# Patient Record
Sex: Female | Born: 1940 | Race: White | Hispanic: No | Smoking: Former smoker
Health system: Southern US, Community
[De-identification: ages and names within clinical notes are randomized; demographics above are authoritative.]

## PROBLEM LIST (undated history)

## (undated) DIAGNOSIS — Q278 Other specified congenital malformations of peripheral vascular system: Secondary | ICD-10-CM

## (undated) DIAGNOSIS — I712 Thoracic aortic aneurysm, without rupture: Secondary | ICD-10-CM

## (undated) DIAGNOSIS — I10 Essential (primary) hypertension: Secondary | ICD-10-CM

## (undated) DIAGNOSIS — G4700 Insomnia, unspecified: Secondary | ICD-10-CM

## (undated) HISTORY — DX: Insomnia, unspecified: G47.00

## (undated) HISTORY — PX: ABDOMINAL HYSTERECTOMY: SHX81

## (undated) HISTORY — PX: ROTATOR CUFF REPAIR: SHX139

## (undated) HISTORY — DX: Other specified congenital malformations of peripheral vascular system: Q27.8

## (undated) HISTORY — DX: Thoracic aortic aneurysm, without rupture: I71.2

---

## 1997-10-25 ENCOUNTER — Ambulatory Visit (HOSPITAL_COMMUNITY): Admission: RE | Admit: 1997-10-25 | Discharge: 1997-10-25 | Payer: Self-pay | Admitting: Family Medicine

## 1998-11-03 ENCOUNTER — Emergency Department (HOSPITAL_COMMUNITY): Admission: EM | Admit: 1998-11-03 | Discharge: 1998-11-03 | Payer: Self-pay | Admitting: Emergency Medicine

## 2001-10-11 ENCOUNTER — Encounter: Payer: Self-pay | Admitting: Family Medicine

## 2001-10-11 ENCOUNTER — Ambulatory Visit (HOSPITAL_COMMUNITY): Admission: RE | Admit: 2001-10-11 | Discharge: 2001-10-11 | Payer: Self-pay | Admitting: Family Medicine

## 2011-04-09 ENCOUNTER — Emergency Department: Payer: Self-pay | Admitting: *Deleted

## 2012-12-30 ENCOUNTER — Emergency Department (HOSPITAL_COMMUNITY)
Admission: EM | Admit: 2012-12-30 | Discharge: 2012-12-30 | Disposition: A | Discharge status: Home / Self Care | Payer: Medicare Other | Attending: Emergency Medicine | Admitting: Emergency Medicine

## 2012-12-30 ENCOUNTER — Emergency Department (HOSPITAL_COMMUNITY): Payer: Medicare Other

## 2012-12-30 ENCOUNTER — Encounter (HOSPITAL_COMMUNITY): Payer: Self-pay | Admitting: Emergency Medicine

## 2012-12-30 DIAGNOSIS — S46909A Unspecified injury of unspecified muscle, fascia and tendon at shoulder and upper arm level, unspecified arm, initial encounter: Secondary | ICD-10-CM | POA: Insufficient documentation

## 2012-12-30 DIAGNOSIS — M25512 Pain in left shoulder: Secondary | ICD-10-CM

## 2012-12-30 DIAGNOSIS — S66912A Strain of unspecified muscle, fascia and tendon at wrist and hand level, left hand, initial encounter: Secondary | ICD-10-CM

## 2012-12-30 DIAGNOSIS — W010XXA Fall on same level from slipping, tripping and stumbling without subsequent striking against object, initial encounter: Secondary | ICD-10-CM | POA: Insufficient documentation

## 2012-12-30 DIAGNOSIS — S8990XA Unspecified injury of unspecified lower leg, initial encounter: Secondary | ICD-10-CM | POA: Insufficient documentation

## 2012-12-30 DIAGNOSIS — Y9389 Activity, other specified: Secondary | ICD-10-CM | POA: Insufficient documentation

## 2012-12-30 DIAGNOSIS — I10 Essential (primary) hypertension: Secondary | ICD-10-CM | POA: Insufficient documentation

## 2012-12-30 DIAGNOSIS — W19XXXA Unspecified fall, initial encounter: Secondary | ICD-10-CM

## 2012-12-30 DIAGNOSIS — M25561 Pain in right knee: Secondary | ICD-10-CM

## 2012-12-30 DIAGNOSIS — T148XXA Other injury of unspecified body region, initial encounter: Secondary | ICD-10-CM

## 2012-12-30 DIAGNOSIS — Z880 Allergy status to penicillin: Secondary | ICD-10-CM | POA: Insufficient documentation

## 2012-12-30 DIAGNOSIS — S63509A Unspecified sprain of unspecified wrist, initial encounter: Secondary | ICD-10-CM | POA: Insufficient documentation

## 2012-12-30 DIAGNOSIS — S4980XA Other specified injuries of shoulder and upper arm, unspecified arm, initial encounter: Secondary | ICD-10-CM | POA: Insufficient documentation

## 2012-12-30 DIAGNOSIS — Y929 Unspecified place or not applicable: Secondary | ICD-10-CM | POA: Insufficient documentation

## 2012-12-30 DIAGNOSIS — Z9889 Other specified postprocedural states: Secondary | ICD-10-CM | POA: Insufficient documentation

## 2012-12-30 DIAGNOSIS — S99929A Unspecified injury of unspecified foot, initial encounter: Secondary | ICD-10-CM | POA: Insufficient documentation

## 2012-12-30 HISTORY — DX: Essential (primary) hypertension: I10

## 2012-12-30 MED ORDER — ACETAMINOPHEN 325 MG PO TABS
650.0000 mg | ORAL_TABLET | Freq: Once | ORAL | Status: AC
  Administered 2012-12-30: 650 mg via ORAL
  Filled 2012-12-30: qty 2

## 2012-12-30 NOTE — ED Notes (Signed)
Pt states that she was walking on her brother's wet deck today and slipped and fell. States that she did not lose consciousness.  C/o lt wrist/hand pain, right knee, left shoulder pain.  Not c/o pain in her head but also has some cuts on her face.

## 2012-12-30 NOTE — ED Provider Notes (Signed)
CSN: [id number]     Arrival date & time 12/30/12  1751 History     First MD Initiated Contact with Patient 12/30/12 1916     Chief Complaint  Patient presents with  . Fall  . Hand Pain  . Shoulder Pain   (Consider location/radiation/quality/duration/timing/severity/associated sxs/prior Treatment) HPI Comments: Pt states that she slipped on a wet deck and now she is having pain in her left wrist,and shoulder and right knee:denies loc:pt states that she hit her left head on the deck:no visual changes  Patient is a 72 y.o. female presenting with fall, hand pain, and shoulder pain. The history is provided by the patient. No language interpreter was used.  Fall This is a new problem. The current episode started today. The problem occurs constantly. The problem has been unchanged. The symptoms are aggravated by bending. She has tried nothing for the symptoms.  Hand Pain  Shoulder Pain    Past Medical History  Diagnosis Date  . Hypertension    Past Surgical History  Procedure Laterality Date  . Rotator cuff repair    . Abdominal hysterectomy    . Cesarean section     History reviewed. No pertinent family history. History  Substance Use Topics  . Smoking status: Never Smoker   . Smokeless tobacco: Not on file  . Alcohol Use: No   OB History   Grav Para Term Preterm Abortions TAB SAB Ect Mult Living                 Review of Systems  Constitutional: Negative.   Respiratory: Negative.   Cardiovascular: Negative.     Allergies  Aspirin; Penicillins; and Vicodin  Home Medications  No current outpatient prescriptions on file. BP 134/92  Pulse 100  Temp(Src) 98.8 F (37.1 C) (Oral)  Resp 18  SpO2 100% Physical Exam  Nursing note and vitals reviewed. Constitutional: She is oriented to person, place, and time. She appears well-developed and well-nourished.  HENT:  Head: Normocephalic and atraumatic.  Nose: Nose normal.  Mouth/Throat: Oropharynx is clear and moist.   Eyes: Conjunctivae and EOM are normal. Pupils are equal, round, and reactive to light.  Neck: Normal range of motion. Neck supple.  Cardiovascular: Normal rate and regular rhythm.   Pulmonary/Chest: Effort normal and breath sounds normal.  Musculoskeletal: Normal range of motion.       Cervical back: Normal.       Thoracic back: Normal.       Lumbar back: Normal.  Swelling noted to the medial aspect of the left wrist:pt has full rom  Neurological: She is alert and oriented to person, place, and time.  Skin:  Abrasion to the left eyebrow    ED Course   Procedures (including critical care time)  Labs Reviewed - No data to display Dg Wrist Complete Left  12/30/2012   *RADIOLOGY REPORT*  Clinical Data: Trauma with pain and limited range of motion.  LEFT WRIST - COMPLETE 3+ VIEW  Comparison: None.  Findings: Mild osteopenia.  Scaphoid intact. No acute fracture or dislocation.  IMPRESSION: No acute osseous abnormality.   Original Report Authenticated By: Jeronimo Greaves, M.D.   Dg Shoulder Left  12/30/2012   *RADIOLOGY REPORT*  Clinical Data: Fall  LEFT SHOULDER - 2+ VIEW  Comparison: None.  Findings: No acute fracture and no dislocation.  IMPRESSION: No acute bony pathology.   Original Report Authenticated By: Jolaine Click, M.D.   Dg Knee Complete 4 Views Right  12/30/2012   *RADIOLOGY  REPORT*  Clinical Data: Fall.  Pain and limited range of motion.  RIGHT KNEE - COMPLETE 4+ VIEW  Comparison: None.  Findings: Question medial soft tissue swelling.  There may also new prepatellar soft tissue swelling. No acute fracture or dislocation. No joint effusion.  IMPRESSION: No acute osseous abnormality.   Original Report Authenticated By: Jeronimo Greaves, M.D.   1. Wrist strain, left, initial encounter   2. Fall, initial encounter   3. Abrasion   4. Knee pain, right   5. Shoulder pain, left     MDM  No acute bony abnormality noted:pt placed in splint for comfort:no loc:pt is neurologically  intact  Teressa Lower, NP 12/30/12 1930

## 2013-01-02 NOTE — ED Provider Notes (Signed)
Medical screening examination/treatment/procedure(s) were performed by non-physician practitioner and as supervising physician I was immediately available for consultation/collaboration.  Brecken Walth, MD 01/02/13 1702 

## 2013-03-08 ENCOUNTER — Other Ambulatory Visit: Payer: Self-pay | Admitting: Neurosurgery

## 2013-03-08 DIAGNOSIS — M542 Cervicalgia: Secondary | ICD-10-CM

## 2013-03-20 ENCOUNTER — Ambulatory Visit
Admission: RE | Admit: 2013-03-20 | Discharge: 2013-03-20 | Disposition: A | Discharge status: Home / Self Care | Payer: Medicare Other | Source: Ambulatory Visit | Attending: Neurosurgery | Admitting: Neurosurgery

## 2013-03-20 DIAGNOSIS — M542 Cervicalgia: Secondary | ICD-10-CM

## 2013-03-20 MED ORDER — GADOBENATE DIMEGLUMINE 529 MG/ML IV SOLN
20.0000 mL | Freq: Once | INTRAVENOUS | Status: AC | PRN
  Administered 2013-03-20: 20 mL via INTRAVENOUS

## 2013-03-21 ENCOUNTER — Other Ambulatory Visit: Payer: Self-pay | Admitting: Neurosurgery

## 2013-03-21 DIAGNOSIS — M542 Cervicalgia: Secondary | ICD-10-CM

## 2014-12-10 ENCOUNTER — Other Ambulatory Visit: Payer: Self-pay | Admitting: Family Medicine

## 2014-12-10 DIAGNOSIS — R221 Localized swelling, mass and lump, neck: Secondary | ICD-10-CM

## 2014-12-13 ENCOUNTER — Ambulatory Visit
Admission: RE | Admit: 2014-12-13 | Discharge: 2014-12-13 | Disposition: A | Discharge status: Home / Self Care | Payer: Medicare Other | Source: Ambulatory Visit | Attending: Family Medicine | Admitting: Family Medicine

## 2014-12-13 DIAGNOSIS — R221 Localized swelling, mass and lump, neck: Secondary | ICD-10-CM

## 2015-02-27 ENCOUNTER — Other Ambulatory Visit: Payer: Self-pay | Admitting: Family Medicine

## 2015-02-27 DIAGNOSIS — R229 Localized swelling, mass and lump, unspecified: Secondary | ICD-10-CM

## 2015-02-28 ENCOUNTER — Observation Stay (HOSPITAL_COMMUNITY)
Admission: EM | Admit: 2015-02-28 | Discharge: 2015-03-01 | Disposition: A | Discharge status: Home / Self Care | Payer: Medicare Other | Attending: Internal Medicine | Admitting: Internal Medicine

## 2015-02-28 ENCOUNTER — Emergency Department (HOSPITAL_COMMUNITY): Payer: Medicare Other

## 2015-02-28 ENCOUNTER — Encounter (HOSPITAL_COMMUNITY): Payer: Self-pay

## 2015-02-28 DIAGNOSIS — Z880 Allergy status to penicillin: Secondary | ICD-10-CM | POA: Diagnosis not present

## 2015-02-28 DIAGNOSIS — Z79899 Other long term (current) drug therapy: Secondary | ICD-10-CM | POA: Insufficient documentation

## 2015-02-28 DIAGNOSIS — R011 Cardiac murmur, unspecified: Secondary | ICD-10-CM | POA: Diagnosis not present

## 2015-02-28 DIAGNOSIS — R110 Nausea: Secondary | ICD-10-CM | POA: Diagnosis not present

## 2015-02-28 DIAGNOSIS — R42 Dizziness and giddiness: Secondary | ICD-10-CM | POA: Diagnosis not present

## 2015-02-28 DIAGNOSIS — R079 Chest pain, unspecified: Secondary | ICD-10-CM | POA: Diagnosis not present

## 2015-02-28 DIAGNOSIS — R05 Cough: Secondary | ICD-10-CM | POA: Diagnosis not present

## 2015-02-28 DIAGNOSIS — I10 Essential (primary) hypertension: Secondary | ICD-10-CM | POA: Diagnosis present

## 2015-02-28 LAB — CBC WITH DIFFERENTIAL/PLATELET
Basophils Absolute: 0.1 10*3/uL (ref 0.0–0.1)
Basophils Relative: 1 %
Eosinophils Absolute: 0 10*3/uL (ref 0.0–0.7)
Eosinophils Relative: 0 %
HCT: 42.1 % (ref 36.0–46.0)
Hemoglobin: 14 g/dL (ref 12.0–15.0)
Lymphocytes Relative: 24 %
Lymphs Abs: 2 10*3/uL (ref 0.7–4.0)
MCH: 30.2 pg (ref 26.0–34.0)
MCHC: 33.3 g/dL (ref 30.0–36.0)
MCV: 90.7 fL (ref 78.0–100.0)
Monocytes Absolute: 0.5 10*3/uL (ref 0.1–1.0)
Monocytes Relative: 6 %
Neutro Abs: 5.5 10*3/uL (ref 1.7–7.7)
Neutrophils Relative %: 69 %
Platelets: 241 10*3/uL (ref 150–400)
RBC: 4.64 MIL/uL (ref 3.87–5.11)
RDW: 12.5 % (ref 11.5–15.5)
WBC: 8 10*3/uL (ref 4.0–10.5)

## 2015-02-28 LAB — URINALYSIS, ROUTINE W REFLEX MICROSCOPIC
Bilirubin Urine: NEGATIVE
Glucose, UA: NEGATIVE mg/dL
Hgb urine dipstick: NEGATIVE
Ketones, ur: NEGATIVE mg/dL
Leukocytes, UA: NEGATIVE
Nitrite: NEGATIVE
Protein, ur: NEGATIVE mg/dL
Specific Gravity, Urine: 1.018 (ref 1.005–1.030)
Urobilinogen, UA: 0.2 mg/dL (ref 0.0–1.0)
pH: 5.5 (ref 5.0–8.0)

## 2015-02-28 LAB — COMPREHENSIVE METABOLIC PANEL
ALT: 11 U/L — ABNORMAL LOW (ref 14–54)
AST: 21 U/L (ref 15–41)
Albumin: 3.7 g/dL (ref 3.5–5.0)
Alkaline Phosphatase: 89 U/L (ref 38–126)
Anion gap: 10 (ref 5–15)
BUN: 14 mg/dL (ref 6–20)
CO2: 26 mmol/L (ref 22–32)
Calcium: 9.3 mg/dL (ref 8.9–10.3)
Chloride: 104 mmol/L (ref 101–111)
Creatinine, Ser: 0.95 mg/dL (ref 0.44–1.00)
GFR calc Af Amer: >60 mL/min (ref >60)
GFR calc non Af Amer: 58 mL/min — ABNORMAL LOW (ref >60)
Glucose, Bld: 112 mg/dL — ABNORMAL HIGH (ref 65–99)
Potassium: 3.6 mmol/L (ref 3.5–5.1)
Sodium: 140 mmol/L (ref 135–145)
Total Bilirubin: 0.6 mg/dL (ref 0.3–1.2)
Total Protein: 6.8 g/dL (ref 6.5–8.1)

## 2015-02-28 LAB — TROPONIN I: Troponin I: <0.03 ng/mL (ref <0.031)

## 2015-02-28 LAB — I-STAT TROPONIN, ED: Troponin i, poc: 0 ng/mL (ref 0.00–0.08)

## 2015-02-28 LAB — TSH: TSH: 0.819 u[IU]/mL (ref 0.350–4.500)

## 2015-02-28 MED ORDER — ACETAMINOPHEN 325 MG PO TABS: 650.0000 mg | ORAL_TABLET | ORAL | Status: DC | PRN

## 2015-02-28 MED ORDER — ONDANSETRON HCL 4 MG/2ML IJ SOLN: 4.0000 mg | Freq: Four times a day (QID) | INTRAMUSCULAR | Status: DC | PRN

## 2015-02-28 MED ORDER — ENOXAPARIN SODIUM 40 MG/0.4ML ~~LOC~~ SOLN
40.0000 mg | SUBCUTANEOUS | Status: DC
  Administered 2015-02-28: 40 mg via SUBCUTANEOUS
  Filled 2015-02-28: qty 0.4

## 2015-02-28 NOTE — Progress Notes (Signed)
Attempted report. Joaquin Bend E, RN 02/28/2015 8:01 PM

## 2015-02-28 NOTE — ED Notes (Signed)
Pt presents with onset of generalized weakness that began yesterday.  Pt denies any pain, reports nausea - denies vomiting.  Pt reports dry cough denies shortness of breath.  Pt also reports area to L side of neck that has been swollen since July.

## 2015-02-28 NOTE — ED Provider Notes (Signed)
CSN: [id number]     Arrival date & time 02/28/15  1434 History   First MD Initiated Contact with Patient 02/28/15 1523     No chief complaint on file.  (Consider location/radiation/quality/duration/timing/severity/associated sxs/prior Treatment) Patient is a 74 y.o. female presenting with weakness.  Weakness This is a new problem. The current episode started yesterday. The problem occurs constantly. The problem has been unchanged. Associated symptoms include coughing, fatigue, nausea and weakness. Pertinent negatives include no abdominal pain, anorexia, chills, congestion, diaphoresis, fever, headaches, numbness, urinary symptoms or vomiting. Associated symptoms comments: Chest pressure. The symptoms are aggravated by walking and exertion. She has tried nothing for the symptoms.    Past Medical History  Diagnosis Date  . Hypertension    Past Surgical History  Procedure Laterality Date  . Rotator cuff repair    . Abdominal hysterectomy    . Cesarean section     History reviewed. No pertinent family history. Social History  Substance Use Topics  . Smoking status: Never Smoker   . Smokeless tobacco: None  . Alcohol Use: No   OB History    No data available     Review of Systems  Constitutional: Positive for fatigue. Negative for fever, chills and diaphoresis.  HENT: Negative for congestion.   Respiratory: Positive for cough.   Gastrointestinal: Positive for nausea. Negative for vomiting, abdominal pain and anorexia.       A few episodes of black stool with peptobismol  Genitourinary: Negative for dysuria, decreased urine volume and vaginal bleeding.  Skin: Negative for pallor.  Neurological: Positive for weakness and light-headedness. Negative for numbness and headaches.  Psychiatric/Behavioral: Negative for behavioral problems and agitation.  All other systems reviewed and are negative.    Allergies  Aspirin; Penicillins; and Vicodin  Home Medications   Prior to  Admission medications   Medication Sig Start Date End Date Taking? Authorizing Provider  clonazePAM (KLONOPIN) 0.5 MG tablet Take 0.125 mg by mouth at bedtime as needed for anxiety.    Historical Provider, MD  ibuprofen (ADVIL,MOTRIN) 200 MG tablet Take 400 mg by mouth every 6 (six) hours as needed for pain.    Historical Provider, MD  metoprolol succinate (TOPROL-XL) 50 MG 24 hr tablet Take 25 mg by mouth 2 (two) times daily. Take with or immediately following a meal.    Historical Provider, MD  triamterene-hydrochlorothiazide (MAXZIDE-25) 37.5-25 MG per tablet Take 1 tablet by mouth daily.    Historical Provider, MD   BP 153/77 mmHg  Pulse 86  Temp(Src) 99.1 F (37.3 C) (Oral)  Resp 18  Ht 5\' 6"  (1.676 m)  Wt 235 lb (106.595 kg)  BMI 37.95 kg/m2  SpO2 98% Physical Exam  Constitutional: She is oriented to person, place, and time. She appears well-developed and well-nourished. No distress.  Obese female in NAD  HENT:  Head: Normocephalic and atraumatic.  Mouth/Throat: No oropharyngeal exudate.  Eyes: Pupils are equal, round, and reactive to light.  Neck: Normal range of motion.  Cardiovascular: Normal rate.   II/IV systolic murmur  Pulmonary/Chest: Effort normal and breath sounds normal. No respiratory distress. She has no wheezes. She has no rales. She exhibits no tenderness.  Abdominal: Soft. She exhibits no distension. There is no tenderness. There is no rebound and no guarding.  Neurological: She is alert and oriented to person, place, and time. No cranial nerve deficit. She exhibits normal muscle tone. Coordination normal.  Unable to assess gait due to lightheadedness  Skin: Skin is warm and dry.  She is not diaphoretic. No erythema.  Psychiatric: She has a normal mood and affect. Her behavior is normal. Judgment and thought content normal.  Nursing note and vitals reviewed.   ED Course  Procedures (including critical care time) Labs Review Labs Reviewed  CBC WITH  DIFFERENTIAL/PLATELET  COMPREHENSIVE METABOLIC PANEL  TSH  URINALYSIS, ROUTINE W REFLEX MICROSCOPIC (NOT AT [facility])  OCCULT BLOOD X 1 CARD TO LAB, STOOL  I-STAT TROPOININ, ED    Imaging Review Dg Chest 2 View  02/28/2015   CLINICAL DATA:  Chest pressure. Weakness. Symptoms began yesterday. Personal history of hypertension.  EXAM: CHEST  2 VIEW  COMPARISON:  None.  FINDINGS: Heart size is normal. Mediastinal shadows are normal except for mild spinal curvature. The pulmonary vascularity is normal. Lungs are clear. No effusions.  IMPRESSION: No active disease.  Spinal curvature.   Electronically Signed   By: Nelson Chimes M.D.   On: 02/28/2015 16:39   I have personally reviewed and evaluated these images and lab results as part of my medical decision-making.   EKG Interpretation   Date/Time:  Friday February 28 2015 14:58:14 EDT Ventricular Rate:  72 PR Interval:  168 QRS Duration: 94 QT Interval:  376 QTC Calculation: 411 R Axis:   -13 Text Interpretation:  Sinus rhythm with frequent Premature ventricular  complexes Nonspecific T wave abnormality Abnormal ECG Confirmed by ZAVITZ   MD, JOSHUA ([id number]) on 02/28/2015 4:34:25 PM     MDM   Patient presents emergency department today with weakness that's worse with exertion associated with lightheadedness started yesterday. Patient also endorses chest pressure during this time. Patient denies any shortness of breath, diaphoresis with these previous episodes until recently having nausea  She has multiple complaints including left side of neck swelling which has been ultrasounded previously in July and has ha multiple physicians evaluate her left neck swelling but without additional imaging other than ultrasound. Patient states that she has this draws fatigue is worse with exertion. She only has a history of hypertension, obesity and denies any diabetes, hypercholesterolemia or smoking. Low well's criteria. Doubt PE.  Patient's heart scores of 5.  Patient will have admission to rule out ACS. She will have regular follow-up regarding this left side neck swelling and at this point do not believe that she needs further imaging or investigation at this time. Patient admitted without further incident.     Final diagnoses:  Chest pain, unspecified chest pain type       Roberto Scales, MD 03/01/15 0246  Elnora Morrison, MD 03/01/15 [phone]

## 2015-02-28 NOTE — ED Notes (Signed)
Upon leaving triage room, pt reports dizziness and had to sit down.  EKG performed.

## 2015-02-28 NOTE — ED Notes (Signed)
Dr. Gardner at bedside 

## 2015-02-28 NOTE — H&P (Signed)
[facility] History and Physical  DESHAUNA CAYSON BHA:[id number] DOB: June 30, 1940 DOA: 02/28/2015  Referring physician: EDP PCP: No primary care provider on file.   Chief Complaint: Chest pressure   HPI: Cristina Bush is a 74 y.o. female who presents to the ED with c/o generalized weakness, central chest pressure.  Symptoms onset yesterday and have been persistent.  Worsen with exertion, better at rest.  Currently 0/10 pain.  She has tried nothing for symptoms.  Review of Systems: Systems reviewed.  As above, otherwise negative  Past Medical History  Diagnosis Date  . Hypertension    Past Surgical History  Procedure Laterality Date  . Rotator cuff repair    . Abdominal hysterectomy    . Cesarean section     Social History:  reports that she has never smoked. She does not have any smokeless tobacco history on file. She reports that she does not drink alcohol or use illicit drugs.  Allergies  Allergen Reactions  . Meloxicam     Severe chest pain  . Aspirin   . Penicillins     hives  . Vicodin [Hydrocodone-Acetaminophen] Nausea And Vomiting    History reviewed. No pertinent family history.   Prior to Admission medications   Medication Sig Start Date End Date Taking? Authorizing Provider  clonazePAM (KLONOPIN) 0.5 MG tablet Take 0.125 mg by mouth at bedtime as needed for anxiety.    Historical Provider, MD  ibuprofen (ADVIL,MOTRIN) 200 MG tablet Take 400 mg by mouth every 6 (six) hours as needed for pain.    Historical Provider, MD  metoprolol succinate (TOPROL-XL) 50 MG 24 hr tablet Take 25 mg by mouth 2 (two) times daily. Take with or immediately following a meal.    Historical Provider, MD  triamterene-hydrochlorothiazide (MAXZIDE-25) 37.5-25 MG per tablet Take 1 tablet by mouth daily.    Historical Provider, MD   Physical Exam: Filed Vitals:   02/28/15 1615  BP: 131/63  Pulse: 95  Temp:   Resp: 31    BP 131/63 mmHg  Pulse 95  Temp(Src) 99.1 F (37.3 C)  (Oral)  Resp 31  Ht 5\' 6"  (1.676 m)  Wt 106.595 kg (235 lb)  BMI 37.95 kg/m2  SpO2 97%  General Appearance:    Alert, oriented, no distress, appears stated age  Head:    Normocephalic, atraumatic  Eyes:    PERRL, EOMI, sclera non-icteric        Nose:   Nares without drainage or epistaxis. Mucosa, turbinates normal  Throat:   Moist mucous membranes. Oropharynx without erythema or exudate.  Neck:   Supple. No carotid bruits.  No thyromegaly.  No lymphadenopathy.   Back:     No CVA tenderness, no spinal tenderness  Lungs:     Clear to auscultation bilaterally, without wheezes, rhonchi or rales  Chest wall:    No tenderness to palpitation  Heart:    Regular rate and rhythm 2/6 murmur  Abdomen:     Soft, non-tender, nondistended, normal bowel sounds, no organomegaly  Genitalia:    deferred  Rectal:    deferred  Extremities:   No clubbing, cyanosis or edema.  Pulses:   2+ and symmetric all extremities  Skin:   Skin color, texture, turgor normal, no rashes or lesions  Lymph nodes:   Cervical, supraclavicular, and axillary nodes normal  Neurologic:   CNII-XII intact. Normal strength, sensation and reflexes      throughout    Labs on Admission:  Basic Metabolic Panel:  Recent Labs Lab 02/28/15 1713  NA 140  K 3.6  CL 104  CO2 26  GLUCOSE 112*  BUN 14  CREATININE 0.95  CALCIUM 9.3   Liver Function Tests:  Recent Labs Lab 02/28/15 1713  AST 21  ALT 11*  ALKPHOS 89  BILITOT 0.6  PROT 6.8  ALBUMIN 3.7   No results for input(s): LIPASE, AMYLASE in the last 168 hours. No results for input(s): AMMONIA in the last 168 hours. CBC:  Recent Labs Lab 02/28/15 1713  WBC 8.0  NEUTROABS 5.5  HGB 14.0  HCT 42.1  MCV 90.7  PLT 241   Cardiac Enzymes: No results for input(s): CKTOTAL, CKMB, CKMBINDEX, TROPONINI in the last 168 hours.  BNP (last 3 results) No results for input(s): PROBNP in the last 8760 hours. CBG: No results for input(s): GLUCAP in the last 168  hours.  Radiological Exams on Admission: Dg Chest 2 View  02/28/2015   CLINICAL DATA:  Chest pressure. Weakness. Symptoms began yesterday. Personal history of hypertension.  EXAM: CHEST  2 VIEW  COMPARISON:  None.  FINDINGS: Heart size is normal. Mediastinal shadows are normal except for mild spinal curvature. The pulmonary vascularity is normal. Lungs are clear. No effusions.  IMPRESSION: No active disease.  Spinal curvature.   Electronically Signed   By: Nelson Chimes M.D.   On: 02/28/2015 16:39    EKG: Independently reviewed.  Assessment/Plan Active Problems:   Chest pain   1. Chest pain - 1. Chest pain obs pathway 2. Serial trops 3. Tele monitor 4. NPO after midnight 5. 2d echo ordered due to presence of murmur.    Code Status: Full Code  Family Communication: No family in room Disposition Plan: Admit to obs   Time spent: 50 min  Berline Semrad M. [facility] Pager [phone]  If 7AM-7PM, please contact the day team taking care of the patient [email or web address] Password [email or web address] 02/28/2015, 7:30 PM

## 2015-02-28 NOTE — Progress Notes (Signed)
Patient arrived from ED via nurse with daughter. VSS and reporting no pain. Cristina Bush, [place] 02/28/2015 2045

## 2015-03-01 ENCOUNTER — Observation Stay (HOSPITAL_COMMUNITY): Payer: Medicare Other

## 2015-03-01 ENCOUNTER — Encounter (HOSPITAL_COMMUNITY): Payer: Self-pay | Admitting: Cardiology

## 2015-03-01 DIAGNOSIS — I209 Angina pectoris, unspecified: Secondary | ICD-10-CM

## 2015-03-01 DIAGNOSIS — R079 Chest pain, unspecified: Secondary | ICD-10-CM | POA: Diagnosis not present

## 2015-03-01 DIAGNOSIS — I10 Essential (primary) hypertension: Secondary | ICD-10-CM | POA: Diagnosis not present

## 2015-03-01 LAB — NM MYOCAR MULTI W/SPECT W/WALL MOTION / EF
Estimated workload: 1 METS
MPHR: 147 {beats}/min
Peak HR: 113 {beats}/min
Percent HR: 76 %
Rest HR: 75 {beats}/min

## 2015-03-01 LAB — TROPONIN I: Troponin I: <0.03 ng/mL (ref <0.031)

## 2015-03-01 MED ORDER — TECHNETIUM TC 99M SESTAMIBI GENERIC - CARDIOLITE
10.0000 | Freq: Once | INTRAVENOUS | Status: AC | PRN
  Administered 2015-03-01: 10 via INTRAVENOUS

## 2015-03-01 MED ORDER — TECHNETIUM TC 99M SESTAMIBI - CARDIOLITE
30.0000 | Freq: Once | INTRAVENOUS | Status: AC | PRN
  Administered 2015-03-01: 30 via INTRAVENOUS

## 2015-03-01 MED ORDER — REGADENOSON 0.4 MG/5ML IV SOLN
INTRAVENOUS | Status: AC
  Filled 2015-03-01: qty 5

## 2015-03-01 MED ORDER — REGADENOSON 0.4 MG/5ML IV SOLN
0.4000 mg | Freq: Once | INTRAVENOUS | Status: AC
  Administered 2015-03-01: 0.4 mg via INTRAVENOUS
  Filled 2015-03-01: qty 5

## 2015-03-01 NOTE — Progress Notes (Signed)
Patient discharge summary gone over in detail with patient and daughter. All questions answered to patients satisfaction. IV removed intact, telemetry discontinued. Patient discharged to home with daughter by way of wheelchair.

## 2015-03-01 NOTE — Progress Notes (Addendum)
Pt escorted to nuclear stress stretcher while lying pt with tremor like activity of upper and lower extremities, no LOC, pt assisted with HOB up with resolution of symptoms, denies pain or hx of seizures. Denies fever or chills. Geannie Risen PA notified.

## 2015-03-01 NOTE — Consult Note (Signed)
Patient ID: Cristina Bush MRN: [id number], DOB/AGE: 08/27/1940   Admit date: 02/28/2015   Primary Physician: No primary care provider on file. Primary Cardiologist: New  Pt. Profile:  74 y/o female with a PMH significant for HTN but no other cardiovascular history, being evaluated for chest pain.   Problem List  Past Medical History  Diagnosis Date  . Hypertension     Past Surgical History  Procedure Laterality Date  . Rotator cuff repair    . Abdominal hysterectomy    . Cesarean section       Allergies  Allergies  Allergen Reactions  . Meloxicam     Severe chest pain  . Aspirin Nausea And Vomiting  . Penicillins Hives and Swelling      . Vicodin [Hydrocodone-Acetaminophen] Nausea And Vomiting    HPI  74 y/o female with no prior cardiac history being evaluated for recent chest discomfort. Her past medical history is significant for treated HTN, on metoprolol and HCTZ. She notes a remote h/o tobacco abuse, having quit over 40 years ago. She denies any h/o HLD or DM. No family h/o CAD.  She reports that she was in her usual state of health until 3 days ago when she developed sudden onset of chest pressure while walking up a flight of stairs. This has never happened to her before. She denies any associated dyspnea, diaphoresis, palpitations, syncope/ near syncope. Non radiating. It resolved spontaneously, though she cannot recall exactly how long it lasted.   The following 2 days, she has felt poorly with generalized malaise and weakness, but no recurrent CP. She does note recent weakness in both upper extremities yesterday, but this has resolved. She also felt nauseated yesterday. Her symptoms prompted her to come to the ED. She did mention her recent CP symptoms, thus cardiology consulted.    She is now CP free and denies any further symptoms since her initial event 3 days ago. She feels better overall. No dyspnea. Cardiac enzymes are negative x 2. EKG shows NSR with  frequent PVCs and nonspecific Twave abnormalities. No prior EKGs for comparison. Telemetry shows NSR with frequent PVCs as well. Electrolytes and TSH are normal.  CXR is unremarkable. CBC and BMP are also unremarkable and vital signs have remained stable.    Home Medications  Prior to Admission medications   Medication Sig Start Date End Date Taking? Authorizing Provider  clonazePAM (KLONOPIN) 0.5 MG tablet Take 0.25 mg by mouth at bedtime.    Yes Historical Provider, MD  ibuprofen (ADVIL,MOTRIN) 200 MG tablet Take 400 mg by mouth every 6 (six) hours as needed for pain.   Yes Historical Provider, MD  metoprolol succinate (TOPROL-XL) 50 MG 24 hr tablet Take 25 mg by mouth 2 (two) times daily. Take with or immediately following a meal.   Yes Historical Provider, MD  triamterene-hydrochlorothiazide (MAXZIDE-25) 37.5-25 MG per tablet Take 0.5 tablets by mouth daily.    Yes Historical Provider, MD    Family History  Family History  Problem Relation Age of Onset  . Hypertension Maternal Grandfather     Social History  Social History   Social History  . Marital Status: Widowed    Spouse Name: N/A  . Number of Children: N/A  . Years of Education: N/A   Occupational History  . Not on file.   Social History Main Topics  . Smoking status: Former Smoker -- 1.00 packs/day for 8 years  . Smokeless tobacco: Not on file  . Alcohol  Use: No  . Drug Use: No  . Sexual Activity: Not on file   Other Topics Concern  . Not on file   Social History Narrative     Review of Systems General:  No chills, fever, night sweats or weight changes.  Cardiovascular:  No chest pain, dyspnea on exertion, edema, orthopnea, palpitations, paroxysmal nocturnal dyspnea. Dermatological: No rash, lesions/masses Respiratory: No cough, dyspnea Urologic: No hematuria, dysuria Abdominal:   No nausea, vomiting, diarrhea, bright red blood per rectum, melena, or hematemesis Neurologic:  No visual changes, wkns,  changes in mental status. All other systems reviewed and are otherwise negative except as noted above.  Physical Exam  Blood pressure 117/68, pulse 70, temperature 98 F (36.7 C), temperature source Oral, resp. rate 16, height 5\' 6"  (1.676 m), weight 237 lb 6.4 oz (107.684 kg), SpO2 98 %.  General: Pleasant, NAD Psych: Normal affect. Neuro: Alert and oriented X 3. Moves all extremities spontaneously. HEENT: Normal  Neck: Supple without bruits or JVD. Lungs:  Resp regular and unlabored, CTA. Heart: RRR with occasional PVCs, 1/6 SM along RSB Abdomen: Soft, non-tender, non-distended, BS + x 4.  Extremities: No clubbing, cyanosis or edema. DP/PT/Radials 2+ and equal bilaterally.  Labs  Troponin [facility] of Care Test)  Recent Labs  02/28/15 1844  TROPIPOC 0.00    Recent Labs  02/28/15 2211 03/01/15 0136  TROPONINI <0.03 <0.03   Lab Results  Component Value Date   WBC 8.0 02/28/2015   HGB 14.0 02/28/2015   HCT 42.1 02/28/2015   MCV 90.7 02/28/2015   PLT 241 02/28/2015     Recent Labs Lab 02/28/15 1713  NA 140  K 3.6  CL 104  CO2 26  BUN 14  CREATININE 0.95  CALCIUM 9.3  PROT 6.8  BILITOT 0.6  ALKPHOS 89  ALT 11*  AST 21  GLUCOSE 112*   No results found for: CHOL, HDL, LDLCALC, TRIG No results found for: DDIMER   Radiology/Studies  Dg Chest 2 View  02/28/2015   CLINICAL DATA:  Chest pressure. Weakness. Symptoms began yesterday. Personal history of hypertension.  EXAM: CHEST  2 VIEW  COMPARISON:  None.  FINDINGS: Heart size is normal. Mediastinal shadows are normal except for mild spinal curvature. The pulmonary vascularity is normal. Lungs are clear. No effusions.  IMPRESSION: No active disease.  Spinal curvature.   Electronically Signed   By: Nelson Chimes M.D.   On: 02/28/2015 16:39    ECG  NSR with frequent PVCS. Nonspecific Twave abnormalities.   Echocardiogram - pending.   ASSESSMENT AND PLAN  Active Problems:   Chest pain  1. Chest Pain: 74  y/o female, whose only cardiac risk factor is HTN, who had one episode of exertional substernal chest pressure 3 days ago w/o any further recurrence. Cardiac enzymes are negative x 3 and EKG shows NSR with frequent PVCs and nonspecific Twave abnormalities. Would recommend NST to risk stratify. Agree with 2D echo. If negative NST, no further ischemic w/u.   2. PVCs: noted on EKG and telemetry. TSH normal as well as electrolytes. She is on metoprolol, 25 mg daily. Will continue to hold for now, given possibility of exercise stress test today. Can resume post stress test. Agree with 2D echo (also to evaluate murmur).  3. HTN: BP is well controlled.     Signed, Lyda Jester, PA-C 03/01/2015, 8:42 AM Typical chest pain although singular in then followed by a very unusual syndrome of weakness in arms and legs and feeling  terrible all over. Notably also, she has NO RECOLLECTION of the events following the chest pain. She says that lack of memory is not normally a problem and her daughter agrees with this.  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  I think it is reasonable to undertake stress testing to exclude coronary disease. She has a significant fear of falling as she has fallen twice injuring her neck on one occasion and her shoulder on another; hence, undertaking treadmill testing is a year which she cannot mentally surmount. We will do it with Lexiscan.  PE unremarkable And ECG only notable for PVCs

## 2015-03-01 NOTE — Discharge Summary (Signed)
Physician Discharge Summary  Cristina Bush:[id number] DOB: [birth year] DOA: 02/28/2015  PCP: No primary care provider on file.  Admit date: 02/28/2015 Discharge date: 03/01/2015  Time spent: > 30 minutes  Recommendations for Outpatient Follow-up:  1. Follow up with PCP in 2-4 weeks   Discharge Diagnoses:  Active Problems:   Chest pain   Essential hypertension  Discharge Condition: stable  Diet recommendation: heart healthy  Filed Weights   02/28/15 1441 02/28/15 2030  Weight: 106.595 kg (235 lb) 107.684 kg (237 lb 6.4 oz)   History of present illness:  Cristina Bush is a 74 y.o. female who presents to the ED with c/o generalized weakness, central chest pressure. Symptoms onset yesterday and have been persistent. Worsen with exertion, better at rest. Currently 0/10 pain. She has tried nothing for symptoms.  Hospital Course:  Patient was admitted to the hospital with chest pain of sudden onset while she was going up some stairs, relieved at rest. She has a history of hypertension, but no other major risk factors. Given description of the pain, cardiology was consulted and patient underwent a stress test without any evidence of reversible ischemia or infarction, normal left ventricular wall motion and left ventricular ejection fraction of 58%, overall deemed to be a low risk stress test. While hospitalized, patient was feeling back to baseline, she denied any chest pain, she was able tablet in the hallway without any dyspnea or other difficulties. She was discharged home in stable condition and instructed to follow-up with her primary care provider as scheduled for blood pressure control.  Procedures:  Stress test   Consultations:  Cardiology   Discharge Exam: Filed Vitals:   03/01/15 1227 03/01/15 1229 03/01/15 1231 03/01/15 1419  BP: 125/71 117/76 121/77 123/78  Pulse: 98 90 85 78  Temp:    98 F (36.7 C)  TempSrc:    Oral  Resp:    16  Height:      Weight:        SpO2:    97%   General: NAD Cardiovascular: RRR Respiratory: CTA biL  Discharge Instructions     Medication List    TAKE these medications        clonazePAM 0.5 MG tablet  Commonly known as:  KLONOPIN  Take 0.25 mg by mouth at bedtime.     ibuprofen 200 MG tablet  Commonly known as:  ADVIL,MOTRIN  Take 400 mg by mouth every 6 (six) hours as needed for pain.     metoprolol succinate 50 MG 24 hr tablet  Commonly known as:  TOPROL-XL  Take 25 mg by mouth 2 (two) times daily. Take with or immediately following a meal.     triamterene-hydrochlorothiazide 37.5-25 MG tablet  Commonly known as:  MAXZIDE-25  Take 0.5 tablets by mouth daily.           Follow-up Information    Follow up with PCP. Schedule an appointment as soon as possible for a visit in 2 weeks.      The results of significant diagnostics from this hospitalization (including imaging, microbiology, ancillary and laboratory) are listed below for reference.    Significant Diagnostic Studies: Dg Chest 2 View  02/28/2015   CLINICAL DATA:  Chest pressure. Weakness. Symptoms began yesterday. Personal history of hypertension.  EXAM: CHEST  2 VIEW  COMPARISON:  None.  FINDINGS: Heart size is normal. Mediastinal shadows are normal except for mild spinal curvature. The pulmonary vascularity is normal. Lungs are clear. No effusions.  IMPRESSION: No active disease.  Spinal curvature.   Electronically Signed   By: Cristina Bush M.D.   On: 02/28/2015 16:39   Nm Myocar Multi W/spect W/wall Motion / Ef  03/01/2015   CLINICAL DATA:  74 year old with current history of hypertension, presenting with chest pain.  EXAM: MYOCARDIAL IMAGING WITH SPECT (REST AND PHARMACOLOGIC-STRESS)  GATED LEFT VENTRICULAR WALL MOTION STUDY  LEFT VENTRICULAR EJECTION FRACTION  TECHNIQUE: Standard myocardial SPECT imaging was performed after resting intravenous injection of 10 mCi Tc-22m sestamibi. Subsequently, intravenous infusion of Lexiscan was  performed under the supervision of the Cardiology staff. At peak effect of the drug, 30 mCi Tc-70m sestamibi was injected intravenously and standard myocardial SPECT imaging was performed. Quantitative gated imaging was also performed to evaluate left ventricular wall motion, and estimate left ventricular ejection fraction.  COMPARISON:  None.  FINDINGS: Perfusion: Large segment of diminished activity involving the inferior wall without reversibility. No perfusion defects elsewhere.  Wall Motion: Normal left ventricular wall motion. No left ventricular dilation.  Left Ventricular Ejection Fraction: 09%  End diastolic volume 80 ml  End systolic volume 33 ml  IMPRESSION: 1. No convincing evidence of reversible ischemia or infarction. Diaphragmatic attenuation involving the anterior wall is favored over infarction.  2. Normal left ventricular wall motion.  3. Left ventricular ejection fraction 58%  4. Low-risk stress test findings*.  *2012 Appropriate Use Criteria for Coronary Revascularization Focused Update: J Am Coll Cardiol. 8119;14(7):829-562. http://content.airportbarriers.com.aspx?articleid=1201161   Electronically Signed   By: Cristina Bush M.D.   On: 03/01/2015 16:25   Labs: Basic Metabolic Panel:  Recent Labs Lab 02/28/15 1713  NA 140  K 3.6  CL 104  CO2 26  GLUCOSE 112*  BUN 14  CREATININE 0.95  CALCIUM 9.3   Liver Function Tests:  Recent Labs Lab 02/28/15 1713  AST 21  ALT 11*  ALKPHOS 89  BILITOT 0.6  PROT 6.8  ALBUMIN 3.7   CBC:  Recent Labs Lab 02/28/15 1713  WBC 8.0  NEUTROABS 5.5  HGB 14.0  HCT 42.1  MCV 90.7  PLT 241   Cardiac Enzymes:  Recent Labs Lab 02/28/15 2211 03/01/15 0136  TROPONINI <0.03 <0.03    Signed:  Marzetta Bush  [facility] 03/01/2015, 5:36 PM

## 2015-03-01 NOTE — Discharge Instructions (Signed)
Follow with PCP in 5-7 days  Please get a complete blood count and chemistry panel checked by your Primary MD at your next visit, and again as instructed by your Primary MD. Please get your medications reviewed and adjusted by your Primary MD.  Please request your Primary MD to go over all Hospital Tests and Procedure/Radiological results at the follow up, please get all Hospital records sent to your Prim MD by signing hospital release before you go home.  If you had Pneumonia of Lung problems at the Hospital: Please get a 2 view Chest X ray done in 6-8 weeks after hospital discharge or sooner if instructed by your Primary MD.  If you have Congestive Heart Failure: Please call your Cardiologist or Primary MD anytime you have any of the following symptoms:  1) 3 pound weight gain in 24 hours or 5 pounds in 1 week  2) shortness of breath, with or without a dry hacking cough  3) swelling in the hands, feet or stomach  4) if you have to sleep on extra pillows at night in order to breathe  Follow cardiac low salt diet and 1.5 lit/day fluid restriction.  If you have diabetes Accuchecks 4 times/day, Once in AM empty stomach and then before each meal. Log in all results and show them to your primary doctor at your next visit. If any glucose reading is under 80 or above 300 call your primary MD immediately.  If you have Seizure/Convulsions/Epilepsy: Please do not drive, operate heavy machinery, participate in activities at heights or participate in high speed sports until you have seen by Primary MD or a Neurologist and advised to do so again.  If you had Gastrointestinal Bleeding: Please ask your Primary MD to check a complete blood count within one week of discharge or at your next visit. Your endoscopic/colonoscopic biopsies that are pending at the time of discharge, will also need to followed by your Primary MD.  Get Medicines reviewed and adjusted. Please take all your medications with you  for your next visit with your Primary MD  Please request your Primary MD to go over all hospital tests and procedure/radiological results at the follow up, please ask your Primary MD to get all Hospital records sent to his/her office.  If you experience worsening of your admission symptoms, develop shortness of breath, life threatening emergency, suicidal or homicidal thoughts you must seek medical attention immediately by calling 911 or calling your MD immediately  if symptoms less severe.  You must read complete instructions/literature along with all the possible adverse reactions/side effects for all the Medicines you take and that have been prescribed to you. Take any new Medicines after you have completely understood and accpet all the possible adverse reactions/side effects.   Do not drive or operate heavy machinery when taking Pain medications.   Do not take more than prescribed Pain, Sleep and Anxiety Medications  Special Instructions: If you have smoked or chewed Tobacco  in the last 2 yrs please stop smoking, stop any regular Alcohol  and or any Recreational drug use.  Wear Seat belts while driving.  Please note You were cared for by a hospitalist during your hospital stay. If you have any questions about your discharge medications or the care you received while you were in the hospital after you are discharged, you can call the unit and asked to speak with the hospitalist on call if the hospitalist that took care of you is not available. Once you are  discharged, your primary care physician will handle any further medical issues. Please note that NO REFILLS for any discharge medications will be authorized once you are discharged, as it is imperative that you return to your primary care physician (or establish a relationship with a primary care physician if you do not have one) for your aftercare needs so that they can reassess your need for medications and monitor your lab values. ° °You  can reach the hospitalist office at phone [phone] or fax [phone] °  °If you do not have a primary care physician, you can call [phone] for a physician referral. ° °Activity: As tolerated with Full fall precautions use walker/cane & assistance as needed ° °Diet: regular ° °Disposition Home ° ° °

## 2015-03-03 ENCOUNTER — Encounter (HOSPITAL_COMMUNITY): Payer: Self-pay | Admitting: Physical Medicine and Rehabilitation

## 2015-03-03 ENCOUNTER — Emergency Department (HOSPITAL_COMMUNITY)
Admission: EM | Admit: 2015-03-03 | Discharge: 2015-03-03 | Disposition: A | Discharge status: Home / Self Care | Payer: Medicare Other | Attending: Emergency Medicine | Admitting: Emergency Medicine

## 2015-03-03 DIAGNOSIS — Z79899 Other long term (current) drug therapy: Secondary | ICD-10-CM | POA: Diagnosis not present

## 2015-03-03 DIAGNOSIS — R5383 Other fatigue: Secondary | ICD-10-CM | POA: Diagnosis present

## 2015-03-03 DIAGNOSIS — I10 Essential (primary) hypertension: Secondary | ICD-10-CM | POA: Diagnosis not present

## 2015-03-03 DIAGNOSIS — Z880 Allergy status to penicillin: Secondary | ICD-10-CM | POA: Diagnosis not present

## 2015-03-03 DIAGNOSIS — Z87891 Personal history of nicotine dependence: Secondary | ICD-10-CM | POA: Insufficient documentation

## 2015-03-03 LAB — CBC WITH DIFFERENTIAL/PLATELET
Basophils Absolute: 0.1 10*3/uL (ref 0.0–0.1)
Basophils Relative: 1 %
Eosinophils Absolute: 0.1 10*3/uL (ref 0.0–0.7)
Eosinophils Relative: 1 %
HCT: 40.1 % (ref 36.0–46.0)
Hemoglobin: 13.6 g/dL (ref 12.0–15.0)
Lymphocytes Relative: 17 %
Lymphs Abs: 1.4 10*3/uL (ref 0.7–4.0)
MCH: 30.6 pg (ref 26.0–34.0)
MCHC: 33.9 g/dL (ref 30.0–36.0)
MCV: 90.1 fL (ref 78.0–100.0)
Monocytes Absolute: 0.7 10*3/uL (ref 0.1–1.0)
Monocytes Relative: 9 %
Neutro Abs: 5.7 10*3/uL (ref 1.7–7.7)
Neutrophils Relative %: 72 %
Platelets: 228 10*3/uL (ref 150–400)
RBC: 4.45 MIL/uL (ref 3.87–5.11)
RDW: 12.6 % (ref 11.5–15.5)
WBC: 8 10*3/uL (ref 4.0–10.5)

## 2015-03-03 LAB — BASIC METABOLIC PANEL
Anion gap: 9 (ref 5–15)
BUN: 12 mg/dL (ref 6–20)
CO2: 28 mmol/L (ref 22–32)
Calcium: 9.3 mg/dL (ref 8.9–10.3)
Chloride: 104 mmol/L (ref 101–111)
Creatinine, Ser: 0.93 mg/dL (ref 0.44–1.00)
GFR calc Af Amer: >60 mL/min (ref >60)
GFR calc non Af Amer: 60 mL/min — ABNORMAL LOW (ref >60)
Glucose, Bld: 111 mg/dL — ABNORMAL HIGH (ref 65–99)
Potassium: 3.5 mmol/L (ref 3.5–5.1)
Sodium: 141 mmol/L (ref 135–145)

## 2015-03-03 LAB — I-STAT TROPONIN, ED: Troponin i, poc: 0 ng/mL (ref 0.00–0.08)

## 2015-03-03 MED ORDER — SODIUM CHLORIDE 0.9 % IV BOLUS (SEPSIS)
1000.0000 mL | Freq: Once | INTRAVENOUS | Status: AC
  Administered 2015-03-03: 1000 mL via INTRAVENOUS

## 2015-03-03 NOTE — ED Provider Notes (Signed)
CSN: [id number]     Arrival date & time 03/03/15  1253 History   First MD Initiated Contact with Patient 03/03/15 1259     Chief Complaint  Patient presents with  . Fatigue   HPI   74 year old female presents today with fatigue. Patient reports that this morning she was standing when she felt a "rush" all over her body with extreme weakness and fatigue. Patient denies any focal deficits, was able to ambulate but sat down and crushed felt stiff she may fall down. Patient reports at that time she called 911, was persistently weak until transport to the hospital. At the time of evaluation patient reports symptoms have all but resolved, she is feeling close to her baseline. Patient reports that she had similar symptoms 2 days ago, but this was associated with chest pain and focal weaknesses. She reports that she was hospitalized with complete cardiac workup with stress test with no significant findings. Patient reports after discharge she was feeling normal, had no complaints until this morning. Patient denies any change in mental status, focal neurological deficits, chest pain, shortness of breath, nausea, vomiting, loss of bowel or bladder functioning. She reports she took half of her blood pressure dictation this morning upon awakening, after this event of extreme weakness she checked her blood pressure with a systolic reading in the 627O, she took the other half of her blood pressure medication at that time.  Past Medical History  Diagnosis Date  . Hypertension    Past Surgical History  Procedure Laterality Date  . Rotator cuff repair    . Abdominal hysterectomy    . Cesarean section     Family History  Problem Relation Age of Onset  . Hypertension Maternal Grandfather    Social History  Substance Use Topics  . Smoking status: Former Smoker -- 1.00 packs/day for 8 years  . Smokeless tobacco: None  . Alcohol Use: No   OB History    No data available     Review of Systems  All other  systems reviewed and are negative.   Allergies  Meloxicam; Aspirin; Penicillins; and Vicodin  Home Medications   Prior to Admission medications   Medication Sig Start Date End Date Taking? Authorizing Provider  clonazePAM (KLONOPIN) 0.5 MG tablet Take 0.25 mg by mouth at bedtime.    Yes Historical Provider, MD  ibuprofen (ADVIL,MOTRIN) 200 MG tablet Take 400 mg by mouth every 6 (six) hours as needed for pain.   Yes Historical Provider, MD  metoprolol succinate (TOPROL-XL) 50 MG 24 hr tablet Take 25 mg by mouth 2 (two) times daily. Take with or immediately following a meal.   Yes Historical Provider, MD  triamterene-hydrochlorothiazide (MAXZIDE-25) 37.5-25 MG per tablet Take 0.5 tablets by mouth daily.    Yes Historical Provider, MD   BP 117/68 mmHg  Pulse 77  Temp(Src) 98 F (36.7 C) (Oral)  Resp 16  SpO2 100%   Physical Exam  Constitutional: She is oriented to person, place, and time. She appears well-developed and well-nourished.  HENT:  Head: Normocephalic and atraumatic.  Eyes: Conjunctivae are normal. Pupils are equal, round, and reactive to light. Right eye exhibits no discharge. Left eye exhibits no discharge. No scleral icterus.  Neck: Normal range of motion. Neck supple. No JVD present. No tracheal deviation present.  Cardiovascular: Normal rate, regular rhythm, normal heart sounds and intact distal pulses.   Pulmonary/Chest: Effort normal and breath sounds normal. No stridor. No respiratory distress. She has no wheezes. She  has no rales. She exhibits no tenderness.  Musculoskeletal: Normal range of motion. She exhibits no edema or tenderness.  Neurological: She is alert and oriented to person, place, and time. Coordination normal.  Skin: Skin is warm and dry.  Psychiatric: She has a normal mood and affect. Her behavior is normal. Judgment and thought content normal.  Nursing note and vitals reviewed.   ED Course  Procedures (including critical care time) Labs  Review Labs Reviewed  BASIC METABOLIC PANEL - Abnormal; Notable for the following:    Glucose, Bld 111 (*)    GFR calc non Af Amer 60 (*)    All other components within normal limits  CBC WITH DIFFERENTIAL/PLATELET  I-STAT TROPOININ, ED    Imaging Review No results found. I have personally reviewed and evaluated these images and lab results as part of my medical decision-making.   EKG Interpretation   Date/Time:  Monday March 03 2015 13:07:42 EDT Ventricular Rate:  69 PR Interval:  182 QRS Duration: 102 QT Interval:  576 QTC Calculation: 617 R Axis:   -24 Text Interpretation:  Sinus rhythm Borderline left axis deviation  Borderline T wave abnormalities Prolonged QT interval No significant  change since last tracing Confirmed by Debby Freiberg [phone]) on  03/03/2015 1:14:53 PM      MDM   Final diagnoses:  Other fatigue    Labs: I-STAT troponin x 2, CBC, BMP- no significant findings  Imaging: EKG  Consults:  Therapeutics: Normal saline  Discharge Meds:   Assessment/Plan: Patient presents with fatigue, this is episodic, resolved at the time my evaluation. No significant findings on laboratory evaluation, vital signs are reassuring. Patient was pleasant, happy, laughing and smiling throughout the evaluation. She was given normal saline here in the ED, even a sandwich. Patient was able to ambulate without difficulty. Patient will be discharged home with instructions to contact her primary care provider follow-up for further evaluation and management. She is instructed to return immediately to the emergency room if new or worsening signs or symptoms present. Patient and family members verbalized understanding and agreement to today's plan and had no further questions or concerns at time of discharge.        Okey Regal, PA-C 03/04/15 1143  Debby Freiberg, MD 03/08/15 [phone]

## 2015-03-03 NOTE — Discharge Instructions (Signed)
Fatigue Fatigue is feeling tired all of the time, a lack of energy, or a lack of motivation. Occasional or mild fatigue is often a normal response to activity or life in general. However, long-lasting (chronic) or extreme fatigue may indicate an underlying medical condition. HOME CARE INSTRUCTIONS  Watch your fatigue for any changes. The following actions may help to lessen any discomfort you are feeling:  Talk to your health care provider about how much sleep you need each night. Try to get the required amount every night.  Take medicines only as directed by your health care provider.  Eat a healthy and nutritious diet. Ask your health care provider if you need help changing your diet.  Drink enough fluid to keep your urine clear or pale yellow.  Practice ways of relaxing, such as yoga, meditation, massage therapy, or acupuncture.  Exercise regularly.   Change situations that cause you stress. Try to keep your work and personal routine reasonable.  Do not abuse illegal drugs.  Limit alcohol intake to no more than 1 drink per day for nonpregnant women and 2 drinks per day for men. One drink equals 12 ounces of beer, 5 ounces of wine, or 1 ounces of hard liquor.  Take a multivitamin, if directed by your health care provider. SEEK MEDICAL CARE IF:   Your fatigue does not get better.  You have a fever.   You have unintentional weight loss or gain.  You have headaches.   You have difficulty:   Falling asleep.  Sleeping throughout the night.  You feel angry, guilty, anxious, or sad.   You are unable to have a bowel movement (constipation).   You skin is dry.   Your legs or another part of your body is swollen.  SEEK IMMEDIATE MEDICAL CARE IF:   You feel confused.   Your vision is blurry.  You feel faint or pass out.   You have a severe headache.   You have severe abdominal, pelvic, or back pain.   You have chest pain, shortness of breath, or an  irregular or fast heartbeat.   You are unable to urinate or you urinate less than normal.   You develop abnormal bleeding, such as bleeding from the rectum, vagina, nose, lungs, or nipples.  You vomit blood.   You have thoughts about harming yourself or committing suicide.   You are worried that you might harm someone else.    This information is not intended to replace advice given to you by your health care provider. Make sure you discuss any questions you have with your health care provider.   Document Released: 03/07/2007 Document Revised: 05/31/2014 Document Reviewed: 09/11/2013 Elsevier Interactive Patient Education 2016 [employer].  Please maintain adequate hydration and nutrition status. Please contact her primary care provider inform them of your visit today and all relevant data. Please follow-up with them this week for reevaluation. If new worsening signs or symptoms present please return immediately to the emergency room for further evaluation and management.

## 2015-03-03 NOTE — ED Notes (Addendum)
Pt presents to department via [facility] for evaluation of generalized weakness. Was recently admitted  for same and discharged home. Symptoms have persisted, continues to feel weak all over. Pt is alert and oriented x4 upon arrival to ED. Pt also reports hypertension and palpitations this morning.

## 2015-03-03 NOTE — ED Notes (Addendum)
Pt reports generalized weakness, hypertension and palpitations this morning, was recently admitted for same. States she noticed BP was elevated at home this morning and decided to double her BP medication, states palpitations and increased weakness started after she took medicine, denies pain upon arrival to ED.

## 2015-03-05 ENCOUNTER — Ambulatory Visit
Admission: RE | Admit: 2015-03-05 | Discharge: 2015-03-05 | Disposition: A | Discharge status: Home / Self Care | Payer: Medicare Other | Source: Ambulatory Visit | Attending: Family Medicine | Admitting: Family Medicine

## 2015-03-05 DIAGNOSIS — R229 Localized swelling, mass and lump, unspecified: Secondary | ICD-10-CM

## 2015-03-05 DIAGNOSIS — I712 Thoracic aortic aneurysm, without rupture, unspecified: Secondary | ICD-10-CM

## 2015-03-05 HISTORY — DX: Thoracic aortic aneurysm, without rupture: I71.2

## 2015-03-05 HISTORY — DX: Thoracic aortic aneurysm, without rupture, unspecified: I71.20

## 2015-03-05 MED ORDER — IOPAMIDOL (ISOVUE-300) INJECTION 61%
75.0000 mL | Freq: Once | INTRAVENOUS | Status: AC | PRN
  Administered 2015-03-05: 75 mL via INTRAVENOUS

## 2015-11-12 DIAGNOSIS — I10 Essential (primary) hypertension: Secondary | ICD-10-CM | POA: Diagnosis not present

## 2015-11-12 DIAGNOSIS — R110 Nausea: Secondary | ICD-10-CM | POA: Diagnosis not present

## 2015-11-12 DIAGNOSIS — F5101 Primary insomnia: Secondary | ICD-10-CM | POA: Diagnosis not present

## 2016-03-18 ENCOUNTER — Other Ambulatory Visit: Payer: Self-pay | Admitting: Family Medicine

## 2016-03-18 DIAGNOSIS — I7121 Aneurysm of the ascending aorta, without rupture: Secondary | ICD-10-CM

## 2016-03-18 DIAGNOSIS — I712 Thoracic aortic aneurysm, without rupture: Secondary | ICD-10-CM

## 2016-03-26 ENCOUNTER — Ambulatory Visit
Admission: RE | Admit: 2016-03-26 | Discharge: 2016-03-26 | Disposition: A | Discharge status: Home / Self Care | Payer: PPO | Source: Ambulatory Visit | Attending: Family Medicine | Admitting: Family Medicine

## 2016-03-26 DIAGNOSIS — Q278 Other specified congenital malformations of peripheral vascular system: Secondary | ICD-10-CM

## 2016-03-26 DIAGNOSIS — I712 Thoracic aortic aneurysm, without rupture, unspecified: Secondary | ICD-10-CM | POA: Insufficient documentation

## 2016-03-26 DIAGNOSIS — I7121 Aneurysm of the ascending aorta, without rupture: Secondary | ICD-10-CM

## 2016-03-26 HISTORY — DX: Other specified congenital malformations of peripheral vascular system: Q27.8

## 2016-03-26 MED ORDER — IOPAMIDOL (ISOVUE-370) INJECTION 76%
80.0000 mL | Freq: Once | INTRAVENOUS | Status: AC | PRN
Start: 2016-03-26 — End: 2016-03-26
  Administered 2016-03-26: 80 mL via INTRAVENOUS

## 2016-04-23 DIAGNOSIS — J209 Acute bronchitis, unspecified: Secondary | ICD-10-CM | POA: Diagnosis not present

## 2016-04-28 ENCOUNTER — Institutional Professional Consult (permissible substitution) (INDEPENDENT_AMBULATORY_CARE_PROVIDER_SITE_OTHER): Payer: PPO | Admitting: Surgery

## 2016-04-28 ENCOUNTER — Encounter: Payer: Self-pay | Admitting: *Deleted

## 2016-04-28 VITALS — BP 140/90 | HR 64 | Resp 20 | Ht 66.0 in | Wt 235.0 lb

## 2016-04-28 DIAGNOSIS — I7121 Aneurysm of the ascending aorta, without rupture: Secondary | ICD-10-CM

## 2016-04-28 DIAGNOSIS — I712 Thoracic aortic aneurysm, without rupture: Secondary | ICD-10-CM | POA: Diagnosis not present

## 2016-04-28 DIAGNOSIS — G4700 Insomnia, unspecified: Secondary | ICD-10-CM | POA: Insufficient documentation

## 2016-05-02 ENCOUNTER — Encounter: Payer: Self-pay | Admitting: Surgery

## 2016-05-02 NOTE — Progress Notes (Signed)
Cardiothoracic Surgery Consultation  PCP is Shirline Frees, MD Referring Provider is Shirline Frees, MD  Chief Complaint  Patient presents with  . Thoracic Aortic Aneurysm    Surgical eval, CTA 03/26/16    HPI:  The patient is a 75 year old woman with a history of hypertension who had a CT scan of the neck to evaluate some left supraclavicular swelling. This showed that the area of concern was due to asymmetric subcutaneous fat. 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 She had a CTA scan of the chest on 03/26/2016 that showed a 4.7 cm fusiform ascending aortic aneurysm.  Past Medical History:  Diagnosis Date  . 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 Hypertension   . Insomnia   . Thoracic aortic aneurysm without rupture ([place]) 03/05/2015   NOTED ON CT OF SOFT TISSUE NECK    Past Surgical History:  Procedure Laterality Date  . ABDOMINAL HYSTERECTOMY    . CESAREAN SECTION    . ROTATOR CUFF REPAIR      Family History  Problem Relation Age of Onset  . Hypertension Maternal Grandfather   No family history of aneurysm disease or aortic dissection. No family history of bicuspid aortic valve disease.  Social History Social History  Substance Use Topics  . Smoking status: Former Smoker    Packs/day: 1.00    Years: 8.00    Quit date: 04/28/1976  . Smokeless tobacco: Never Used  . Alcohol use No    Current Outpatient Prescriptions  Medication Sig Dispense Refill  . clonazePAM (KLONOPIN) 0.5 MG tablet Take 0.25 mg by mouth at bedtime.     Marland Kitchen ibuprofen (ADVIL,MOTRIN) 200 MG tablet Take 400 mg by mouth every 6 (six) hours as needed for pain.    . metoprolol succinate (TOPROL-XL) 50 MG 24 hr tablet Take 25 mg by mouth 2 (two) times daily. Take with or immediately following a meal.    . triamterene-hydrochlorothiazide (MAXZIDE-25) 37.5-25 MG per tablet Take 0.5  tablets by mouth daily.      No current facility-administered medications for this visit.     Allergies  Allergen Reactions  . Meloxicam     Severe chest pain  . Aspirin Nausea And Vomiting  . Azithromycin Other (See Comments)    RAPID HEARTBEAT  . Codeine Nausea And Vomiting  . Penicillins Hives and Swelling      . Vicodin [Hydrocodone-Acetaminophen] Nausea And Vomiting    Review of Systems  Constitutional: Negative.   HENT: Negative.   Eyes: Negative.   Respiratory:       Bronchitis  Cardiovascular: Negative for chest pain.  Gastrointestinal: Negative.   Endocrine: Negative.   Genitourinary: Negative.   Musculoskeletal: Positive for arthralgias and neck pain.  Skin: Negative.   Allergic/Immunologic: Negative.   Neurological: Negative.   Hematological: Negative.   Psychiatric/Behavioral: Negative.     BP 140/90   Pulse 64   Resp 20   Ht 5\' 6"  (1.676 m)   Wt 235 lb (106.6 kg)   SpO2 98% Comment: RA  BMI 37.93 kg/m  Physical Exam  Constitutional: She is oriented to person, place, and time. She appears well-developed and well-nourished. No distress.  HENT:  Head: Normocephalic and atraumatic.  Mouth/Throat: Oropharynx is clear and moist.  Eyes: EOM are normal. Pupils are equal, round, and reactive to light.  Neck: Normal range of motion. Neck supple.  No JVD present. No thyromegaly present.  Cardiovascular: Normal rate, regular rhythm and normal heart sounds.   No murmur heard. Pulmonary/Chest: Effort normal and breath sounds normal. No respiratory distress.  Abdominal: Soft. Bowel sounds are normal. She exhibits no distension. There is no tenderness.  Musculoskeletal: Normal range of motion. She exhibits no edema.  Lymphadenopathy:    She has no cervical adenopathy.  Neurological: She is alert and oriented to person, place, and time.  Skin: Skin is warm and dry.  Psychiatric: She has a normal mood and affect.    Diagnostic Tests:  CLINICAL DATA:   75 year old female with thoracic aortic aneurysm  EXAM: CT ANGIOGRAPHY CHEST WITH CONTRAST  TECHNIQUE: Multidetector CT imaging of the chest was performed using the standard protocol during bolus administration of intravenous contrast. Multiplanar CT image reconstructions and MIPs were obtained to evaluate the vascular anatomy.  CONTRAST:  80 mL Isovue 370  COMPARISON:  Prior chest x-ray 02/28/2015  FINDINGS: Cardiovascular: 4 vessel arch. Aberrant right subclavian artery. Aneurysmal dilatation of the tubular portion of the ascending thoracic aorta with a maximal transverse diameter of 4.7 cm. There is no effacement of the sino-tubular junction. The aortic root is within normal limits for size. New the heart is normal in size. No pericardial effusion. No large central pulmonary embolus. The transverse and descending thoracic aorta are normal in caliber.  Mediastinum/Nodes: Small hiatal hernia. Normal appearance of the thyroid gland and thoracic inlet. No suspicious mediastinal or hilar adenopathy.  Lungs/Pleura: Nonspecific 4 mm subpleural nodular opacity affiliated with the minor fissure (image 56 series 8).  Upper Abdomen: Left hepatic artery is replaced to the left gastric artery. Small circumscribed low-attenuation lesion in the lateral segment of the left liver measures 8 mm in diameter and is too small for accurate characterization. Statistically, this is highly likely a benign cyst. Small high attenuation focus in the gallbladder lumen most consistent with cholelithiasis.  Musculoskeletal: No acute fracture or aggressive appearing lytic or blastic osseous lesion. Intermediate density nodules in the subcutaneous fat along the back likely represent sebaceous or epidermoid cysts. Mild dextro convex scoliosis of the mid thoracic spine with associated multilevel degenerative disc disease.  Review of the MIP images confirms the above  findings.  IMPRESSION: VASCULAR  1. Fusiform aneurysmal dilatation of the ascending thoracic aorta with a maximal diameter of 4.7 cm. Ascending thoracic aortic aneurysm. Recommend semi-annual imaging followup by CTA or MRA and referral to cardiothoracic surgery if not already obtained. This recommendation follows 2010 ACCF/AHA/AATS/ACR/ASA/SCA/SCAI/SIR/STS/SVM Guidelines for the Diagnosis and Management of Patients With Thoracic Aortic Disease. Circulation. 2010; 121: [id number] 2. Aberrant right subclavian artery. 3. Replaced left hepatic artery. NON VASCULAR  1. Nonspecific 4 mm subpleural nodular opacity within the minor fissure. No follow-up needed if patient is low-risk. Non-contrast chest CT can be considered in 12 months if patient is high-risk. This recommendation follows the consensus statement: Guidelines for Management of Incidental Pulmonary Nodules Detected on CT Images: From the Fleischner Society 2017; Radiology 2017; 284:228-243. 2. Intermediate density nodules in the subcutaneous fat immediately subjacent to the skin of the upper and mid back likely represent benign processes such as sebaceous or epidermoid cysts. 3. Small hiatal hernia. 4. Cholelithiasis. 5. Mild dextro convex scoliosis with associated multilevel degenerative disc disease.  Signed,  Criselda Peaches, MD  [facility]  [facility]   Electronically Signed   By: Jacqulynn Cadet M.D.   On: 03/26/2016 16:00   Impression:  This 75 year  old woman has a 4.7 cm fusiform ascending aortic aneurysm of unknown duration. This is below the usual 5.5 cm threshold for recommending surgical treatment but it should be followed with CTA to detect progressive enlargement. I discussed the importance of good blood pressure control in preventing progressive enlargement and aortic dissection. I reviewed the CT images with her and answered her  questions.   Plan:  I will see her back in one year with a CTA of the chest.  I spent 30 minutes performing this consultation and > 50% of this time was spent face to face counseling and coordinating the care of this patient's ascending aortic aneurysm.  Gaye Pollack, MD [facility] [phone]

## 2016-09-28 ENCOUNTER — Other Ambulatory Visit: Payer: Self-pay | Admitting: Family Medicine

## 2016-09-28 DIAGNOSIS — I712 Thoracic aortic aneurysm, without rupture: Secondary | ICD-10-CM

## 2016-09-28 DIAGNOSIS — I7121 Aneurysm of the ascending aorta, without rupture: Secondary | ICD-10-CM

## 2016-09-28 DIAGNOSIS — R911 Solitary pulmonary nodule: Secondary | ICD-10-CM

## 2016-10-07 ENCOUNTER — Ambulatory Visit
Admission: RE | Admit: 2016-10-07 | Discharge: 2016-10-07 | Disposition: A | Discharge status: Home / Self Care | Payer: PPO | Source: Ambulatory Visit | Attending: Family Medicine | Admitting: Family Medicine

## 2016-10-07 DIAGNOSIS — I7121 Aneurysm of the ascending aorta, without rupture: Secondary | ICD-10-CM

## 2016-10-07 DIAGNOSIS — R911 Solitary pulmonary nodule: Secondary | ICD-10-CM

## 2016-10-07 DIAGNOSIS — I712 Thoracic aortic aneurysm, without rupture: Secondary | ICD-10-CM

## 2016-10-07 MED ORDER — IOPAMIDOL (ISOVUE-370) INJECTION 76%
75.0000 mL | Freq: Once | INTRAVENOUS | Status: AC | PRN
  Administered 2016-10-07: 75 mL via INTRAVENOUS

## 2016-10-08 ENCOUNTER — Other Ambulatory Visit: Payer: Self-pay | Admitting: Surgery

## 2016-10-08 DIAGNOSIS — I712 Thoracic aortic aneurysm, without rupture, unspecified: Secondary | ICD-10-CM

## 2016-10-14 DIAGNOSIS — I712 Thoracic aortic aneurysm, without rupture: Secondary | ICD-10-CM | POA: Diagnosis not present

## 2016-10-14 DIAGNOSIS — I10 Essential (primary) hypertension: Secondary | ICD-10-CM | POA: Diagnosis not present

## 2016-11-03 ENCOUNTER — Other Ambulatory Visit: Payer: PPO

## 2016-11-03 ENCOUNTER — Ambulatory Visit: Payer: PPO | Admitting: Surgery

## 2017-04-18 DIAGNOSIS — I10 Essential (primary) hypertension: Secondary | ICD-10-CM | POA: Diagnosis not present

## 2017-10-18 DIAGNOSIS — I10 Essential (primary) hypertension: Secondary | ICD-10-CM | POA: Diagnosis not present

## 2017-10-18 DIAGNOSIS — I712 Thoracic aortic aneurysm, without rupture: Secondary | ICD-10-CM | POA: Diagnosis not present

## 2017-10-18 DIAGNOSIS — F5101 Primary insomnia: Secondary | ICD-10-CM | POA: Diagnosis not present

## 2017-11-11 DIAGNOSIS — R739 Hyperglycemia, unspecified: Secondary | ICD-10-CM | POA: Diagnosis not present

## 2017-12-20 DIAGNOSIS — L259 Unspecified contact dermatitis, unspecified cause: Secondary | ICD-10-CM | POA: Diagnosis not present

## 2018-04-17 DIAGNOSIS — F5101 Primary insomnia: Secondary | ICD-10-CM | POA: Diagnosis not present

## 2018-04-17 DIAGNOSIS — I712 Thoracic aortic aneurysm, without rupture: Secondary | ICD-10-CM | POA: Diagnosis not present

## 2018-04-17 DIAGNOSIS — I10 Essential (primary) hypertension: Secondary | ICD-10-CM | POA: Diagnosis not present

## 2018-04-17 DIAGNOSIS — R739 Hyperglycemia, unspecified: Secondary | ICD-10-CM | POA: Diagnosis not present

## 2018-04-25 ENCOUNTER — Other Ambulatory Visit: Payer: Self-pay | Admitting: Family Medicine

## 2018-04-25 DIAGNOSIS — I712 Thoracic aortic aneurysm, without rupture: Secondary | ICD-10-CM

## 2018-04-25 DIAGNOSIS — I7121 Aneurysm of the ascending aorta, without rupture: Secondary | ICD-10-CM

## 2018-04-28 ENCOUNTER — Ambulatory Visit
Admission: RE | Admit: 2018-04-28 | Discharge: 2018-04-28 | Disposition: A | Discharge status: Home / Self Care | Payer: PPO | Source: Ambulatory Visit | Attending: Family Medicine | Admitting: Family Medicine

## 2018-04-28 DIAGNOSIS — I7121 Aneurysm of the ascending aorta, without rupture: Secondary | ICD-10-CM

## 2018-04-28 DIAGNOSIS — I712 Thoracic aortic aneurysm, without rupture: Secondary | ICD-10-CM | POA: Diagnosis not present

## 2018-04-28 MED ORDER — IOPAMIDOL (ISOVUE-370) INJECTION 76%
75.0000 mL | Freq: Once | INTRAVENOUS | Status: AC | PRN
  Administered 2018-04-28: 75 mL via INTRAVENOUS

## 2018-05-10 ENCOUNTER — Ambulatory Visit: Payer: PPO | Admitting: Surgery

## 2018-05-10 NOTE — Progress Notes (Unsigned)
This encounter was created in error - please disregard.

## 2018-05-10 NOTE — Progress Notes (Unsigned)
     HPI:  The patient is a 77 year old woman with a history of hypertension who returns today for follow-up of a 4.7 cm fusiform ascending aortic aneurysm that was noted on CTA of the chest on 03/26/2016.  I saw her last on 04/28/2016 and recommended that she have a follow-up scan in 1 year.  Current Outpatient Medications  Medication Sig Dispense Refill  . clonazePAM (KLONOPIN) 0.5 MG tablet Take 0.25 mg by mouth at bedtime.     Marland Kitchen ibuprofen (ADVIL,MOTRIN) 200 MG tablet Take 400 mg by mouth every 6 (six) hours as needed for pain.    . metoprolol succinate (TOPROL-XL) 50 MG 24 hr tablet Take 25 mg by mouth 2 (two) times daily. Take with or immediately following a meal.    . triamterene-hydrochlorothiazide (MAXZIDE-25) 37.5-25 MG per tablet Take 0.5 tablets by mouth daily.      No current facility-administered medications for this visit.      Physical Exam: ***  Diagnostic Tests: ***  Impression: ***  Plan: ***   Gaye Pollack, MD [facility] [phone]

## 2018-05-11 ENCOUNTER — Encounter: Payer: Self-pay | Admitting: Surgery

## 2018-06-14 ENCOUNTER — Ambulatory Visit: Payer: PPO | Admitting: Surgery

## 2018-06-14 ENCOUNTER — Encounter: Payer: Self-pay | Admitting: Surgery

## 2018-06-14 ENCOUNTER — Other Ambulatory Visit: Payer: Self-pay

## 2018-06-14 VITALS — BP 124/70 | HR 72 | Resp 16 | Ht 66.0 in | Wt 234.0 lb

## 2018-06-14 DIAGNOSIS — I712 Thoracic aortic aneurysm, without rupture, unspecified: Secondary | ICD-10-CM

## 2018-06-14 NOTE — Progress Notes (Signed)
HPI  The patient is a 78 year old woman with a history of hypertension who returns today for follow-up of a 4.5 cm fusiform ascending aortic aneurysm that was noted on CTA of the chest on 03/26/2016.  Her initial CT showed a measurement of 4.7 cm and then in 2018 it was measured at 4.5 cm.  Her most recent CT scan on 04/28/2018 showed a stable fusiform ascending aortic aneurysm at 4.5 cm in the mid ascending aorta.  She feels well without any chest or back pain.  She has had no shortness of breath.  She does report that when she had her CT scan she had marked tenderness of her scalp and lost some patches of hair which she feels was related to the CT scan or possibly the intravenous contrast.  She said that she also lost some patches of hair after her prior CT scans but did not have the scalp tenderness at that time.  Current Outpatient Medications  Medication Sig Dispense Refill  . metoprolol tartrate (LOPRESSOR) 50 MG tablet Take 25 mg by mouth 2 (two) times daily.    Marland Kitchen triamterene-hydrochlorothiazide (MAXZIDE-25) 37.5-25 MG per tablet Take 0.5 tablets by mouth daily.      No current facility-administered medications for this visit.       Physical Exam  BP 124/70 (BP Location: Right Arm, Patient Position: Sitting, Cuff Size: Large) Comment: manually  Pulse 72   Resp 16   Ht 5\' 6"  (1.676 m)   Wt 234 lb (106.1 kg)   SpO2 97% Comment: RA  BMI 37.77 kg/m  She looks well. Cardiac exam shows regular rate and rhythm with normal heart sounds.  There is no murmur. Lungs are clear.   Diagnostic Tests:   CLINICAL DATA:  Thoracic aortic aneurysm, follow-up, currently asymptomatic  EXAM: CT ANGIOGRAPHY CHEST WITH CONTRAST  TECHNIQUE: Multidetector CT imaging of the chest was performed using the standard protocol during bolus administration of intravenous contrast. Multiplanar CT image reconstructions and MIPs were obtained to evaluate the vascular anatomy.  CONTRAST:  84mL  ISOVUE-370 IOPAMIDOL (ISOVUE-370) INJECTION 76%  COMPARISON:  10/07/2016 and previous  FINDINGS: Cardiovascular: Heart size normal. No pericardial effusion. Fair contrast opacification of pulmonary artery branches; the exam was not optimized for detection of pulmonary emboli. Good contrast opacification of the thoracic aorta. Transverse dimensions as follows:  3.8 cm sinuses of Valsalva  3.2 cm sino-tubular junction  4.5 cm mid ascending (stable)  3.8 cm distal ascending/proximal arch  2.9 cm distal arch/proximal descending  2.4 cm distal descending  No dissection or stenosis. Patent brachiocephalic arterial origin anatomy. Afferent right subclavian artery with retroesophageal course, an anatomic variant. Minimal calcified aortic plaque in the descending thoracic aorta. Visualized proximal abdominal aorta unremarkable.  Mediastinum/Nodes: Small hiatal hernia. No hilar or mediastinal adenopathy.  Lungs/Pleura: No pleural effusion. No pneumothorax. 5 mm angular nodule adjacent to the minor fissure, stable since 03/26/2016, possibly intrapulmonary nodule. Lungs otherwise clear.  Upper Abdomen: 1.4 cm probable hepatic cyst in segment 6, stable. No acute findings.  Musculoskeletal: Mild thoracic dextroscoliosis with mild spondylitic change, no evident underlying vertebral anomaly. Negative for fracture or other acute bone abnormality.  Review of the MIP images confirms the above findings.  IMPRESSION: Stable 4.5 cm ascending aortic aneurysm without complicating features. Recommend semi-annual imaging followup by CTA or MRA and referral to cardiothoracic surgery if not already obtained. This recommendation follows 2010 ACCF/AHA/AATS/ACR/ASA/SCA/SCAI/SIR/STS/SVM Guidelines for the Diagnosis and Management of Patients With Thoracic Aortic Disease. 2010; 121:  [id number].   Electronically Signed   By: Lucrezia Europe M.D.   On: 04/28/2018  15:10  Impression:  She has a stable 4.5 cm fusiform ascending aortic aneurysm which is not changed since 2017.  Her blood pressure is under good control when she is on a beta-blocker.  I reviewed the CT images with her and answered her questions.  It is still well below the 5.5 cm surgical threshold.  I recommended that we do a follow-up scan in 1 year.  She is concerned about the scalp tenderness and hair loss that she had after her CT scan which I have never heard of before but I guess it is possible that she could have had an allergic reaction to contrast.  I think it would be fine to do the scan without IV contrast since most of the time it gives an accurate measurement of the ascending aorta.  She is in agreement with that.  Plan:  I will see her back in 1 year with a CT of the chest without intravenous contrast to reassess her ascending aortic aneurysm.  I spent 15 minutes performing this established patient evaluation and > 50% of this time was spent face to face counseling and coordinating the care of this patient's aortic aneurysm.  Gaye Pollack, MD [facility] [phone]

## 2019-04-08 ENCOUNTER — Encounter (HOSPITAL_COMMUNITY): Payer: Self-pay | Admitting: Emergency Medicine

## 2019-04-08 ENCOUNTER — Emergency Department (HOSPITAL_COMMUNITY)
Admission: EM | Admit: 2019-04-08 | Discharge: 2019-04-08 | Disposition: A | Discharge status: Home / Self Care | Payer: PPO | Attending: Emergency Medicine | Admitting: Emergency Medicine

## 2019-04-08 DIAGNOSIS — R52 Pain, unspecified: Secondary | ICD-10-CM | POA: Diagnosis not present

## 2019-04-08 DIAGNOSIS — R110 Nausea: Secondary | ICD-10-CM | POA: Diagnosis not present

## 2019-04-08 DIAGNOSIS — Z87891 Personal history of nicotine dependence: Secondary | ICD-10-CM | POA: Insufficient documentation

## 2019-04-08 DIAGNOSIS — Z79899 Other long term (current) drug therapy: Secondary | ICD-10-CM | POA: Insufficient documentation

## 2019-04-08 DIAGNOSIS — I10 Essential (primary) hypertension: Secondary | ICD-10-CM | POA: Insufficient documentation

## 2019-04-08 DIAGNOSIS — R42 Dizziness and giddiness: Secondary | ICD-10-CM | POA: Diagnosis not present

## 2019-04-08 DIAGNOSIS — I499 Cardiac arrhythmia, unspecified: Secondary | ICD-10-CM | POA: Diagnosis not present

## 2019-04-08 LAB — CBG MONITORING, ED: Glucose-Capillary: 134 mg/dL — ABNORMAL HIGH (ref 70–99)

## 2019-04-08 MED ORDER — SODIUM CHLORIDE 0.9% FLUSH: 3.0000 mL | Freq: Once | INTRAVENOUS | Status: DC

## 2019-04-08 MED ORDER — MECLIZINE HCL 12.5 MG PO TABS: 12.5000 mg | ORAL_TABLET | Freq: Three times a day (TID) | ORAL | 0 refills | Status: AC | PRN

## 2019-04-08 MED ORDER — ONDANSETRON HCL 4 MG PO TABS: 4.0000 mg | ORAL_TABLET | Freq: Four times a day (QID) | ORAL | 0 refills | Status: AC

## 2019-04-08 NOTE — Discharge Instructions (Signed)
Please return for any problem.  Follow-up with your regular care provider tomorrow as instructed. 

## 2019-04-08 NOTE — ED Triage Notes (Signed)
Patient here from home via EMS with complaints of dizziness that started around 330 this evening. States that she took BP meds at noon. Nausea no vomiting. 8mg  Zofran given.

## 2019-04-08 NOTE — ED Provider Notes (Signed)
[facility] DEPT Provider Note   CSN: [id number] Arrival date & time: 04/08/19  1828     History   Chief Complaint Chief Complaint  Patient presents with  . Dizziness    HPI Cristina Bush is a 78 y.o. female.     78 year old female with prior medical history as detailed below presents for evaluation of reported dizziness.  She reports an episode of dizziness this afternoon.  She reports that she felt like the room was spinning around her.  This was associated with nausea.  There is no specific inciting event per her report.  She denies associated chest pain, shortness of breath, visual change, focal weakness, paresthesia, or other complaint.  She denies prior history of vertigo.  She is now asymptomatic.  She reports that the Zofran given by EMS helped her nausea and the "room spinning" sensation has completely resolved.  The history is provided by the patient and medical records.  Dizziness Quality:  Head spinning and room spinning Severity:  Mild Onset quality:  Sudden Duration:  1 hour Timing:  Rare Progression:  Resolved Chronicity:  New Relieved by:  Nothing Worsened by:  Nothing Ineffective treatments:  None tried   Past Medical History:  Diagnosis Date  . 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 Hypertension   . Insomnia   . Thoracic aortic aneurysm without rupture ([place]) 03/05/2015   NOTED ON CT OF SOFT TISSUE NECK    Patient Active Problem List   Diagnosis Date Noted  . Insomnia   . Thoracic aortic aneurysm without rupture ([place]) 03/26/2016  . 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 Essential hypertension 03/01/2015  . Chest pain 02/28/2015    Past Surgical History:  Procedure Laterality Date  . ABDOMINAL HYSTERECTOMY    . CESAREAN SECTION    . ROTATOR CUFF REPAIR       OB History   No obstetric history on file.      Home Medications    Prior to Admission medications   Medication Sig  Start Date End Date Taking? Authorizing Provider  metoprolol tartrate (LOPRESSOR) 50 MG tablet Take 25 mg by mouth 2 (two) times daily.   Yes [provider]  triamterene-hydrochlorothiazide (MAXZIDE-25) 37.5-25 MG per tablet Take 0.5 tablets by mouth daily.    Yes [provider]    Family History Family History  Problem Relation Age of Onset  . Hypertension Maternal Grandfather     Social History Social History   Tobacco Use  . Smoking status: Former Smoker    Packs/day: 1.00    Years: 8.00    Pack years: 8.00    Quit date: 04/28/1976    Years since quitting: 42.9  . Smokeless tobacco: Never Used  Substance Use Topics  . Alcohol use: No  . Drug use: No     Allergies   Meloxicam, Aspirin, Azithromycin, Codeine, Penicillins, and Vicodin [hydrocodone-acetaminophen]   Review of Systems Review of Systems  Neurological: Positive for dizziness.  All other systems reviewed and are negative.    Physical Exam Updated Vital Signs BP (!) 152/87 (BP Location: Left Arm)   Pulse (!) 52   Temp 98.1 F (36.7 C) (Oral)   Resp 16   SpO2 98%   Physical Exam Vitals signs and nursing note reviewed.  Constitutional:      General: She is not in acute distress.    Appearance: She is well-developed.  HENT:     Head: Normocephalic  and atraumatic.  Eyes:     Conjunctiva/sclera: Conjunctivae normal.     Pupils: Pupils are equal, round, and reactive to light.  Neck:     Musculoskeletal: Normal range of motion and neck supple.  Cardiovascular:     Rate and Rhythm: Normal rate and regular rhythm.     Heart sounds: Normal heart sounds.  Pulmonary:     Effort: Pulmonary effort is normal. No respiratory distress.     Breath sounds: Normal breath sounds.  Abdominal:     General: There is no distension.     Palpations: Abdomen is soft.     Tenderness: There is no abdominal tenderness.  Musculoskeletal: Normal range of motion.        General: No deformity.  Skin:     General: Skin is warm and dry.  Neurological:     General: No focal deficit present.     Mental Status: She is alert and oriented to person, place, and time. Mental status is at baseline.     Cranial Nerves: No cranial nerve deficit.     Sensory: No sensory deficit.     Motor: No weakness.     Coordination: Coordination normal.     Comments: Alert Oriented Normal speech No facial droop No reported vertiginous symptoms Normal gait       ED Treatments / Results  Labs (all labs ordered are listed, but only abnormal results are displayed) Labs Reviewed  CBG MONITORING, ED - Abnormal; Notable for the following components:      Result Value   Glucose-Capillary 134 (*)    All other components within normal limits    EKG EKG Interpretation  Date/Time:  Sunday April 08 2019 18:49:12 EST Ventricular Rate:  65 PR Interval:    QRS Duration: 105 QT Interval:  449 QTC Calculation: 467 R Axis:   -29 Text Interpretation: Sinus rhythm Borderline left axis deviation No significant change since last tracing Confirmed by Lajean Saver [id number]) on 04/08/2019 7:02:15 PM   Radiology No results found.  Procedures Procedures (including critical care time)  Medications Ordered in ED Medications - No data to display   Initial Impression / Assessment and Plan / ED Course  I have reviewed the triage vital signs and the nursing notes.  Pertinent labs & imaging results that were available during my care of the patient were reviewed by me and considered in my medical decision making (see chart for details).        MDM  Screen complete  Cristina Bush was evaluated in Emergency Department on 04/08/2019 for the symptoms described in the history of present illness. She was evaluated in the context of the global COVID-19 pandemic, which necessitated consideration that the patient might be at risk for infection with the SARS-CoV-2 virus that causes COVID-19. Institutional protocols  and algorithms that pertain to the evaluation of patients at risk for COVID-19 are in a state of rapid change based on information released by regulatory bodies including the CDC and federal and state organizations. These policies and algorithms were followed during the patient's care in the ED.  Patient is presenting for evaluation of transient episode of dizziness.  Patient's described symptoms are most consistent with likely benign positional vertigo.  Her symptoms have completely resolved upon arrival to the ED.  She declines additional work-up or evaluation - specifically labs/CT/etc.  She now desires discharge.  She will be discharged with prescriptions for both Antivert and Zofran.  She is advised to closely follow-up  with her regular care provider tomorrow.  Strict return precautions given and understood.  Final Clinical Impressions(s) / ED Diagnoses   Final diagnoses:  Vertigo    ED Discharge Orders         Ordered    meclizine (ANTIVERT) 12.5 MG tablet  3 times daily PRN     04/08/19 2006    ondansetron (ZOFRAN) 4 MG tablet  Every 6 hours     04/08/19 2006           Valarie Merino, MD 04/08/19 2008

## 2019-04-12 DIAGNOSIS — I10 Essential (primary) hypertension: Secondary | ICD-10-CM | POA: Diagnosis not present

## 2019-04-12 DIAGNOSIS — R42 Dizziness and giddiness: Secondary | ICD-10-CM | POA: Diagnosis not present

## 2019-05-09 ENCOUNTER — Other Ambulatory Visit: Payer: Self-pay | Admitting: *Deleted

## 2019-05-09 DIAGNOSIS — I712 Thoracic aortic aneurysm, without rupture, unspecified: Secondary | ICD-10-CM

## 2019-06-12 DIAGNOSIS — I10 Essential (primary) hypertension: Secondary | ICD-10-CM | POA: Diagnosis not present

## 2019-06-12 DIAGNOSIS — R110 Nausea: Secondary | ICD-10-CM | POA: Diagnosis not present

## 2019-06-13 ENCOUNTER — Ambulatory Visit: Payer: PPO | Admitting: Surgery

## 2019-06-13 ENCOUNTER — Other Ambulatory Visit: Payer: PPO

## 2019-08-15 DIAGNOSIS — M79601 Pain in right arm: Secondary | ICD-10-CM | POA: Diagnosis not present

## 2019-08-28 ENCOUNTER — Telehealth: Payer: Self-pay | Admitting: *Deleted

## 2019-08-28 NOTE — Telephone Encounter (Signed)
"  I saw one of your doctors back in 2012 or 2013.  I can't think of the doctor right now but he treated me.  I think he gave me Meloxicam.  Can you pull that up and let me know the dose that he prescribed for me?  If I'm not available, you can text me or leave me a message."  I saw Dr. Milinda Pointer.  He wrote me a prescription for something and I had an allergic reaction to it.  I didn't tell me you all.  My  Primary doctor wants to know what that medicine was.  I don't remember, I think it was Meloxicam."

## 2019-08-29 NOTE — Telephone Encounter (Signed)
I left her a message that we do not have any records of seeing her nor prescribing any medications in the past.  I told her if she had been seen, it was several years ago when we were on a different computer system.  I asked her to call if she has any further questions.

## 2019-10-01 DIAGNOSIS — M62838 Other muscle spasm: Secondary | ICD-10-CM | POA: Diagnosis not present

## 2019-10-01 DIAGNOSIS — M549 Dorsalgia, unspecified: Secondary | ICD-10-CM | POA: Diagnosis not present

## 2019-12-11 DIAGNOSIS — I10 Essential (primary) hypertension: Secondary | ICD-10-CM | POA: Diagnosis not present

## 2019-12-11 DIAGNOSIS — R7309 Other abnormal glucose: Secondary | ICD-10-CM | POA: Diagnosis not present

## 2019-12-11 DIAGNOSIS — R739 Hyperglycemia, unspecified: Secondary | ICD-10-CM | POA: Diagnosis not present

## 2019-12-11 DIAGNOSIS — I712 Thoracic aortic aneurysm, without rupture: Secondary | ICD-10-CM | POA: Diagnosis not present

## 2020-01-31 ENCOUNTER — Telehealth: Payer: Self-pay | Admitting: *Deleted
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
## 2020-02-19 DIAGNOSIS — I10 Essential (primary) hypertension: Secondary | ICD-10-CM | POA: Diagnosis not present

## 2020-03-25 DIAGNOSIS — S8992XA Unspecified injury of left lower leg, initial encounter: Secondary | ICD-10-CM | POA: Diagnosis not present

## 2020-03-25 DIAGNOSIS — M25562 Pain in left knee: Secondary | ICD-10-CM | POA: Diagnosis not present

## 2020-03-25 DIAGNOSIS — W19XXXA Unspecified fall, initial encounter: Secondary | ICD-10-CM | POA: Diagnosis not present

## 2020-03-26 DIAGNOSIS — S8002XA Contusion of left knee, initial encounter: Secondary | ICD-10-CM | POA: Diagnosis not present

## 2020-03-28 DIAGNOSIS — W19XXXD Unspecified fall, subsequent encounter: Secondary | ICD-10-CM | POA: Diagnosis not present

## 2020-03-28 DIAGNOSIS — S80919A Unspecified superficial injury of unspecified knee, initial encounter: Secondary | ICD-10-CM | POA: Diagnosis not present

## 2020-03-28 DIAGNOSIS — R42 Dizziness and giddiness: Secondary | ICD-10-CM | POA: Diagnosis not present

## 2020-04-09 DIAGNOSIS — S8002XA Contusion of left knee, initial encounter: Secondary | ICD-10-CM | POA: Diagnosis not present

## 2020-05-07 DIAGNOSIS — S8002XD Contusion of left knee, subsequent encounter: Secondary | ICD-10-CM | POA: Diagnosis not present

## 2020-05-29 DIAGNOSIS — S8012XD Contusion of left lower leg, subsequent encounter: Secondary | ICD-10-CM | POA: Diagnosis not present

## 2020-05-29 DIAGNOSIS — M25562 Pain in left knee: Secondary | ICD-10-CM | POA: Diagnosis not present

## 2020-06-12 DIAGNOSIS — M25562 Pain in left knee: Secondary | ICD-10-CM | POA: Diagnosis not present

## 2020-06-12 DIAGNOSIS — I10 Essential (primary) hypertension: Secondary | ICD-10-CM | POA: Diagnosis not present

## 2020-09-11 ENCOUNTER — Emergency Department
Admission: EM | Admit: 2020-09-11 | Discharge: 2020-09-11 | Disposition: A | Discharge status: Home / Self Care | Payer: PPO | Attending: Student in an Organized Health Care Education/Training Program | Admitting: Student in an Organized Health Care Education/Training Program

## 2020-09-11 ENCOUNTER — Other Ambulatory Visit: Payer: Self-pay

## 2020-09-11 ENCOUNTER — Encounter: Payer: Self-pay | Admitting: Medical Oncology

## 2020-09-11 ENCOUNTER — Emergency Department: Payer: PPO

## 2020-09-11 DIAGNOSIS — S40012A Contusion of left shoulder, initial encounter: Secondary | ICD-10-CM | POA: Insufficient documentation

## 2020-09-11 DIAGNOSIS — S3991XA Unspecified injury of abdomen, initial encounter: Secondary | ICD-10-CM | POA: Insufficient documentation

## 2020-09-11 DIAGNOSIS — M47816 Spondylosis without myelopathy or radiculopathy, lumbar region: Secondary | ICD-10-CM | POA: Diagnosis not present

## 2020-09-11 DIAGNOSIS — S2020XA Contusion of thorax, unspecified, initial encounter: Secondary | ICD-10-CM | POA: Insufficient documentation

## 2020-09-11 DIAGNOSIS — K8020 Calculus of gallbladder without cholecystitis without obstruction: Secondary | ICD-10-CM | POA: Diagnosis not present

## 2020-09-11 DIAGNOSIS — R109 Unspecified abdominal pain: Secondary | ICD-10-CM

## 2020-09-11 DIAGNOSIS — R079 Chest pain, unspecified: Secondary | ICD-10-CM | POA: Diagnosis not present

## 2020-09-11 DIAGNOSIS — Z79899 Other long term (current) drug therapy: Secondary | ICD-10-CM | POA: Insufficient documentation

## 2020-09-11 DIAGNOSIS — R0602 Shortness of breath: Secondary | ICD-10-CM | POA: Diagnosis not present

## 2020-09-11 DIAGNOSIS — S299XXA Unspecified injury of thorax, initial encounter: Secondary | ICD-10-CM | POA: Diagnosis not present

## 2020-09-11 DIAGNOSIS — M47814 Spondylosis without myelopathy or radiculopathy, thoracic region: Secondary | ICD-10-CM | POA: Diagnosis not present

## 2020-09-11 DIAGNOSIS — Z87891 Personal history of nicotine dependence: Secondary | ICD-10-CM | POA: Diagnosis not present

## 2020-09-11 DIAGNOSIS — Y92410 Unspecified street and highway as the place of occurrence of the external cause: Secondary | ICD-10-CM | POA: Insufficient documentation

## 2020-09-11 DIAGNOSIS — K5730 Diverticulosis of large intestine without perforation or abscess without bleeding: Secondary | ICD-10-CM | POA: Diagnosis not present

## 2020-09-11 DIAGNOSIS — I10 Essential (primary) hypertension: Secondary | ICD-10-CM | POA: Diagnosis not present

## 2020-09-11 DIAGNOSIS — R42 Dizziness and giddiness: Secondary | ICD-10-CM | POA: Diagnosis not present

## 2020-09-11 DIAGNOSIS — I2510 Atherosclerotic heart disease of native coronary artery without angina pectoris: Secondary | ICD-10-CM | POA: Diagnosis not present

## 2020-09-11 DIAGNOSIS — K449 Diaphragmatic hernia without obstruction or gangrene: Secondary | ICD-10-CM | POA: Diagnosis not present

## 2020-09-11 DIAGNOSIS — Z041 Encounter for examination and observation following transport accident: Secondary | ICD-10-CM | POA: Diagnosis not present

## 2020-09-11 DIAGNOSIS — N289 Disorder of kidney and ureter, unspecified: Secondary | ICD-10-CM | POA: Diagnosis not present

## 2020-09-11 DIAGNOSIS — S298XXA Other specified injuries of thorax, initial encounter: Secondary | ICD-10-CM

## 2020-09-11 LAB — COMPREHENSIVE METABOLIC PANEL
ALT: 13 U/L (ref 0–44)
AST: 23 U/L (ref 15–41)
Albumin: 4 g/dL (ref 3.5–5.0)
Alkaline Phosphatase: 90 U/L (ref 38–126)
Anion gap: 11 (ref 5–15)
BUN: 21 mg/dL (ref 8–23)
CO2: 23 mmol/L (ref 22–32)
Calcium: 9.5 mg/dL (ref 8.9–10.3)
Chloride: 104 mmol/L (ref 98–111)
Creatinine, Ser: 1.04 mg/dL — ABNORMAL HIGH (ref 0.44–1.00)
GFR, Estimated: 55 mL/min — ABNORMAL LOW (ref >60)
Glucose, Bld: 139 mg/dL — ABNORMAL HIGH (ref 70–99)
Potassium: 3.7 mmol/L (ref 3.5–5.1)
Sodium: 138 mmol/L (ref 135–145)
Total Bilirubin: 0.7 mg/dL (ref 0.3–1.2)
Total Protein: 7.2 g/dL (ref 6.5–8.1)

## 2020-09-11 LAB — TROPONIN I (HIGH SENSITIVITY)
Troponin I (High Sensitivity): 8 ng/L (ref <18)
Troponin I (High Sensitivity): 11 ng/L (ref <18)

## 2020-09-11 LAB — CBC WITH DIFFERENTIAL/PLATELET
Abs Immature Granulocytes: 0.07 10*3/uL (ref 0.00–0.07)
Basophils Absolute: 0.1 10*3/uL (ref 0.0–0.1)
Basophils Relative: 1 %
Eosinophils Absolute: 0.1 10*3/uL (ref 0.0–0.5)
Eosinophils Relative: 1 %
HCT: 40.2 % (ref 36.0–46.0)
Hemoglobin: 13.4 g/dL (ref 12.0–15.0)
Immature Granulocytes: 1 %
Lymphocytes Relative: 20 %
Lymphs Abs: 2 10*3/uL (ref 0.7–4.0)
MCH: 29.6 pg (ref 26.0–34.0)
MCHC: 33.3 g/dL (ref 30.0–36.0)
MCV: 88.9 fL (ref 80.0–100.0)
Monocytes Absolute: 0.8 10*3/uL (ref 0.1–1.0)
Monocytes Relative: 8 %
Neutro Abs: 7.1 10*3/uL (ref 1.7–7.7)
Neutrophils Relative %: 69 %
Platelets: 219 10*3/uL (ref 150–400)
RBC: 4.52 MIL/uL (ref 3.87–5.11)
RDW: 13.2 % (ref 11.5–15.5)
WBC: 10.1 10*3/uL (ref 4.0–10.5)
nRBC: 0 % (ref 0.0–0.2)

## 2020-09-11 MED ORDER — IOHEXOL 300 MG/ML  SOLN
100.0000 mL | Freq: Once | INTRAMUSCULAR | Status: AC | PRN
  Administered 2020-09-11: 100 mL via INTRAVENOUS

## 2020-09-11 MED ORDER — ONDANSETRON 4 MG PO TBDP
4.0000 mg | ORAL_TABLET | Freq: Once | ORAL | Status: AC
  Administered 2020-09-11: 4 mg via ORAL
  Filled 2020-09-11: qty 1

## 2020-09-11 MED ORDER — ONDANSETRON 4 MG PO TBDP: 4.0000 mg | ORAL_TABLET | Freq: Three times a day (TID) | ORAL | 0 refills | Status: AC | PRN

## 2020-09-11 MED ORDER — TRAMADOL HCL 50 MG PO TABS: 50.0000 mg | ORAL_TABLET | Freq: Four times a day (QID) | ORAL | 0 refills | Status: AC | PRN

## 2020-09-11 MED ORDER — TRAMADOL HCL 50 MG PO TABS
50.0000 mg | ORAL_TABLET | Freq: Once | ORAL | Status: AC
  Administered 2020-09-11: 50 mg via ORAL
  Filled 2020-09-11: qty 1

## 2020-09-11 NOTE — ED Notes (Signed)
E sig pad not working. Unable to print discharge consent. Pt gave verbal consent to discharge.
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
## 2020-09-11 NOTE — ED Notes (Signed)
Helped pt ambulate to hall bathroom. Steady gait. Son at bedside; back from [place].
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
## 2020-09-16 DIAGNOSIS — M13862 Other specified arthritis, left knee: Secondary | ICD-10-CM | POA: Diagnosis not present

## 2020-09-16 DIAGNOSIS — M13842 Other specified arthritis, left hand: Secondary | ICD-10-CM | POA: Diagnosis not present

## 2020-09-16 DIAGNOSIS — S63602A Unspecified sprain of left thumb, initial encounter: Secondary | ICD-10-CM | POA: Diagnosis not present

## 2020-09-19 DIAGNOSIS — S20211D Contusion of right front wall of thorax, subsequent encounter: Secondary | ICD-10-CM | POA: Diagnosis not present

## 2020-09-19 DIAGNOSIS — S8992XD Unspecified injury of left lower leg, subsequent encounter: Secondary | ICD-10-CM | POA: Diagnosis not present

## 2020-09-19 DIAGNOSIS — N289 Disorder of kidney and ureter, unspecified: Secondary | ICD-10-CM | POA: Diagnosis not present

## 2020-09-22 ENCOUNTER — Other Ambulatory Visit: Payer: Self-pay | Admitting: Family Medicine

## 2020-09-22 DIAGNOSIS — N289 Disorder of kidney and ureter, unspecified: Secondary | ICD-10-CM

## 2020-09-29 DIAGNOSIS — S86912A Strain of unspecified muscle(s) and tendon(s) at lower leg level, left leg, initial encounter: Secondary | ICD-10-CM | POA: Diagnosis not present

## 2020-10-11 ENCOUNTER — Other Ambulatory Visit: Payer: PPO

## 2020-10-22 DIAGNOSIS — M25562 Pain in left knee: Secondary | ICD-10-CM | POA: Diagnosis not present

## 2020-11-04 DIAGNOSIS — M940 Chondrocostal junction syndrome [Tietze]: Secondary | ICD-10-CM | POA: Diagnosis not present

## 2020-11-04 DIAGNOSIS — M25562 Pain in left knee: Secondary | ICD-10-CM | POA: Diagnosis not present

## 2020-11-13 DIAGNOSIS — M25562 Pain in left knee: Secondary | ICD-10-CM | POA: Diagnosis not present

## 2020-11-27 DIAGNOSIS — M25562 Pain in left knee: Secondary | ICD-10-CM | POA: Diagnosis not present

## 2020-12-10 DIAGNOSIS — I10 Essential (primary) hypertension: Secondary | ICD-10-CM | POA: Diagnosis not present

## 2020-12-10 DIAGNOSIS — F5101 Primary insomnia: Secondary | ICD-10-CM | POA: Diagnosis not present

## 2020-12-10 DIAGNOSIS — S83411D Sprain of medial collateral ligament of right knee, subsequent encounter: Secondary | ICD-10-CM | POA: Diagnosis not present

## 2020-12-17 DIAGNOSIS — M25561 Pain in right knee: Secondary | ICD-10-CM | POA: Diagnosis not present

## 2020-12-17 DIAGNOSIS — M25562 Pain in left knee: Secondary | ICD-10-CM | POA: Diagnosis not present

## 2020-12-31 DIAGNOSIS — M25561 Pain in right knee: Secondary | ICD-10-CM | POA: Diagnosis not present

## 2021-01-17 DIAGNOSIS — M25561 Pain in right knee: Secondary | ICD-10-CM | POA: Diagnosis not present

## 2021-02-06 DIAGNOSIS — M25561 Pain in right knee: Secondary | ICD-10-CM | POA: Diagnosis not present

## 2021-04-13 ENCOUNTER — Encounter (HOSPITAL_COMMUNITY): Payer: Self-pay | Admitting: Radiology

## 2021-05-05 ENCOUNTER — Ambulatory Visit
Admission: RE | Admit: 2021-05-05 | Discharge: 2021-05-05 | Disposition: A | Discharge status: Home / Self Care | Payer: PPO | Source: Ambulatory Visit | Attending: Family Medicine | Admitting: Family Medicine

## 2021-05-05 DIAGNOSIS — K7689 Other specified diseases of liver: Secondary | ICD-10-CM | POA: Diagnosis not present

## 2021-05-05 DIAGNOSIS — N289 Disorder of kidney and ureter, unspecified: Secondary | ICD-10-CM

## 2021-05-05 DIAGNOSIS — C801 Malignant (primary) neoplasm, unspecified: Secondary | ICD-10-CM | POA: Diagnosis not present

## 2021-05-05 DIAGNOSIS — R590 Localized enlarged lymph nodes: Secondary | ICD-10-CM | POA: Diagnosis not present

## 2021-05-05 MED ORDER — GADOBENATE DIMEGLUMINE 529 MG/ML IV SOLN
20.0000 mL | Freq: Once | INTRAVENOUS | Status: AC | PRN
  Administered 2021-05-05: 20 mL via INTRAVENOUS

## 2021-06-25 DIAGNOSIS — L309 Dermatitis, unspecified: Secondary | ICD-10-CM | POA: Diagnosis not present

## 2021-06-25 DIAGNOSIS — F5101 Primary insomnia: Secondary | ICD-10-CM | POA: Diagnosis not present

## 2021-06-25 DIAGNOSIS — I10 Essential (primary) hypertension: Secondary | ICD-10-CM | POA: Diagnosis not present

## 2021-06-25 DIAGNOSIS — N281 Cyst of kidney, acquired: Secondary | ICD-10-CM | POA: Diagnosis not present

## 2022-03-30 DIAGNOSIS — N281 Cyst of kidney, acquired: Secondary | ICD-10-CM | POA: Diagnosis not present

## 2022-03-30 DIAGNOSIS — I10 Essential (primary) hypertension: Secondary | ICD-10-CM | POA: Diagnosis not present

## 2022-03-30 DIAGNOSIS — S83411D Sprain of medial collateral ligament of right knee, subsequent encounter: Secondary | ICD-10-CM | POA: Diagnosis not present

## 2022-03-30 DIAGNOSIS — F5101 Primary insomnia: Secondary | ICD-10-CM | POA: Diagnosis not present

## 2022-04-12 ENCOUNTER — Other Ambulatory Visit: Payer: Self-pay | Admitting: Family Medicine

## 2022-04-12 DIAGNOSIS — N281 Cyst of kidney, acquired: Secondary | ICD-10-CM

## 2022-06-20 ENCOUNTER — Emergency Department (HOSPITAL_COMMUNITY)
Admission: EM | Admit: 2022-06-20 | Discharge: 2022-06-20 | Disposition: A | Discharge status: Home / Self Care | Payer: PPO

## 2022-06-20 NOTE — ED Notes (Signed)
Called for pt by first and last name x3. No response.

## 2022-06-20 NOTE — ED Notes (Signed)
Attempted to call patient on 2 separate accounts with no answer.

## 2022-06-22 DIAGNOSIS — J40 Bronchitis, not specified as acute or chronic: Secondary | ICD-10-CM | POA: Diagnosis not present

## 2022-06-22 DIAGNOSIS — R051 Acute cough: Secondary | ICD-10-CM | POA: Diagnosis not present
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
## 2022-09-21 DIAGNOSIS — J029 Acute pharyngitis, unspecified: Secondary | ICD-10-CM | POA: Diagnosis not present

## 2022-10-05 DIAGNOSIS — N1831 Chronic kidney disease, stage 3a: Secondary | ICD-10-CM | POA: Diagnosis not present

## 2022-10-05 DIAGNOSIS — N281 Cyst of kidney, acquired: Secondary | ICD-10-CM | POA: Diagnosis not present

## 2022-10-05 DIAGNOSIS — I10 Essential (primary) hypertension: Secondary | ICD-10-CM | POA: Diagnosis not present

## 2022-10-05 DIAGNOSIS — I700 Atherosclerosis of aorta: Secondary | ICD-10-CM | POA: Diagnosis not present

## 2022-10-05 DIAGNOSIS — F5101 Primary insomnia: Secondary | ICD-10-CM | POA: Diagnosis not present

## 2022-10-06 ENCOUNTER — Encounter: Payer: Self-pay | Admitting: Family Medicine

## 2022-10-06 DIAGNOSIS — N281 Cyst of kidney, acquired: Secondary | ICD-10-CM

## 2022-10-13 ENCOUNTER — Other Ambulatory Visit: Payer: Self-pay | Admitting: Family Medicine

## 2022-10-13 DIAGNOSIS — N281 Cyst of kidney, acquired: Secondary | ICD-10-CM

## 2022-10-21 ENCOUNTER — Ambulatory Visit
Admission: RE | Admit: 2022-10-21 | Discharge: 2022-10-21 | Disposition: A | Discharge status: Home / Self Care | Payer: PPO | Source: Ambulatory Visit | Attending: Family Medicine | Admitting: Family Medicine

## 2022-10-21 DIAGNOSIS — N281 Cyst of kidney, acquired: Secondary | ICD-10-CM | POA: Diagnosis not present

## 2022-10-21 DIAGNOSIS — K8020 Calculus of gallbladder without cholecystitis without obstruction: Secondary | ICD-10-CM | POA: Diagnosis not present

## 2022-10-21 MED ORDER — GADOBUTROL 1 MMOL/ML IV SOLN
10.0000 mL | Freq: Once | INTRAVENOUS | Status: AC | PRN
  Administered 2022-10-21: 10 mL via INTRAVENOUS

## 2022-11-29 DIAGNOSIS — D49512 Neoplasm of unspecified behavior of left kidney: Secondary | ICD-10-CM | POA: Diagnosis not present

## 2022-11-29 DIAGNOSIS — N3946 Mixed incontinence: Secondary | ICD-10-CM | POA: Diagnosis not present

## 2023-03-08 DIAGNOSIS — M549 Dorsalgia, unspecified: Secondary | ICD-10-CM | POA: Diagnosis not present

## 2023-03-10 ENCOUNTER — Ambulatory Visit
Admission: EM | Admit: 2023-03-10 | Discharge: 2023-03-10 | Disposition: A | Discharge status: Home / Self Care | Payer: PPO

## 2023-03-10 DIAGNOSIS — B029 Zoster without complications: Secondary | ICD-10-CM

## 2023-03-10 MED ORDER — METHYLPREDNISOLONE ACETATE 80 MG/ML IJ SUSP
60.0000 mg | Freq: Once | INTRAMUSCULAR | Status: AC
  Administered 2023-03-10: 60 mg via INTRAMUSCULAR

## 2023-03-10 MED ORDER — VALACYCLOVIR HCL 1 G PO TABS: 1000.0000 mg | ORAL_TABLET | Freq: Three times a day (TID) | ORAL | 0 refills | Status: AC

## 2023-03-10 MED ORDER — BACLOFEN 5 MG PO TABS: 5.0000 mg | ORAL_TABLET | Freq: Every evening | ORAL | 0 refills | Status: AC | PRN

## 2023-03-10 MED ORDER — LIDOCAINE 5 % EX OINT: 1.0000 | TOPICAL_OINTMENT | CUTANEOUS | 0 refills | Status: AC | PRN

## 2023-03-10 NOTE — Discharge Instructions (Signed)
Today you are being treated for shingles, based on the description of your symptoms and presentation of your rash this is the most likely cause for your shoulder pain  You have been given an injection of steroid today here in office ideally will see some improvement in the next 30 minutes to an hour, starting tomorrow he may restart oral steroids, avoid use of ibuprofen as she take the medicine  Begin use of valacyclovir taking every 8 hours for the next 7 days to reduce the amount of virus in your body that is causing the rash and symptoms, will help rash to resolve quicker which will help to minimize symptoms  Rash is considered contagious until fully healed and scabbed therefore keep covered especially when around other people  You may apply topical lidocaine cream over the rash to help soothe the skin  You may apply ice over the affected area in 10 to 15-minute intervals for comfort  If you continue to have pain please follow-up with your primary doctor for further evaluation and management

## 2023-03-10 NOTE — ED Provider Notes (Signed)
Renaldo Fiddler    CSN: [id number] Arrival date & time: 03/10/23  1048      History   Chief Complaint Chief Complaint  Patient presents with   Back Pain    HPI Cristina Bush is a 82 y.o. female.   Patient presents for evaluation of right upper shoulder blade pain beginning 2 weeks ago without injury or trauma.  Endorses that over the past 2 weeks she has been completing yard work and moving sticks around but unsure if symptoms started prior to or after.  Pain has been occurring intermittently, worsens at nighttime and when lying directly onto the right side.  Intermittently experiencing sharp shooting pain and a burning sensation.  Was evaluated by PCP 2 days ago and started on prednisone taper, minimally helpful.  Had been using lidocaine patches but has begun to experience pruritic rash beginning 1 day ago.  Has used lidocaine patches in the past without issue.  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      Home Medications    Prior to Admission medications   Medication Sig Start Date End Date Taking? Authorizing Provider  ALPRAZolam Prudy Feeler) 0.25 MG tablet 1/2-1 tablet prior to procedure, repeat if needed Orally for 1 days 10/19/22  Yes [provider]  Baclofen 5 MG TABS Take 1 tablet (5 mg total) by mouth at bedtime as needed. 03/10/23  Yes Dakai Braithwaite R, NP  lidocaine  (XYLOCAINE) 5 % ointment Apply 1 Application topically as needed. 03/10/23  Yes Eliabeth Shoff R, NP  TOPROL XL 50 MG 24 hr tablet 1 tablet Orally as directed for 90 days 01/03/13  Yes [provider]  valACYclovir (VALTREX) 1000 MG tablet Take 1 tablet (1,000 mg total) by mouth 3 (three) times daily for 7 days. 03/10/23 03/17/23 Yes Nekisha Mcdiarmid, Elita Boone, NP  meclizine (ANTIVERT) 12.5 MG tablet Take 1 tablet (12.5 mg total) by mouth 3 (three) times daily as needed for dizziness. 04/08/19   Wynetta Fines, MD  metoprolol tartrate (LOPRESSOR) 50 MG tablet Take 25 mg by mouth 2 (two) times daily.    [provider]  ondansetron (ZOFRAN) 4 MG tablet Take 1 tablet (4 mg total) by mouth every 6 (six) hours. 04/08/19   Wynetta Fines, MD  ondansetron (ZOFRAN-ODT) 4 MG disintegrating tablet Take 1 tablet (4 mg total) by mouth every 8 (eight) hours as needed. 09/11/20   Fisher, Roselyn Bering, PA-C  traMADol (ULTRAM) 50 MG tablet Take 1 tablet (50 mg total) by mouth every 6 (six) hours as needed. 09/11/20   Fisher, Roselyn Bering, PA-C  triamterene-hydrochlorothiazide (MAXZIDE-25) 37.5-25 MG per tablet Take 0.5 tablets by mouth daily.     [provider]    Family History Family History  Problem Relation Age of Onset   Hypertension  Maternal Grandfather     Social History Social History   Tobacco Use   Smoking status: Former    Current packs/day: 0.00    Average packs/day: 1 pack/day for 8.0 years (8.0 ttl pk-yrs)    Types: Cigarettes    Start date: 04/28/1968    Quit date: 04/28/1976    Years since quitting: 46.8   Smokeless tobacco: Never  Substance Use Topics   Alcohol use: No   Drug use: No     Allergies   Meloxicam, Aspirin, Azithromycin, Codeine, Penicillins, and Vicodin [hydrocodone-acetaminophen]   Review of Systems Review of Systems   Physical Exam Triage Vital Signs ED Triage Vitals  Encounter Vitals Group     BP 03/10/23 1102 139/80     Systolic BP  Percentile --      Diastolic BP Percentile --      Pulse Rate 03/10/23 1102 65     Resp 03/10/23 1102 17     Temp 03/10/23 1102 97.9 F (36.6 C)     Temp src --      SpO2 03/10/23 1102 98 %     Weight --      Height --      Head Circumference --      Peak Flow --      Pain Score 03/10/23 1056 0     Pain Loc --      Pain Education --      Exclude from Growth Chart --    No data found.  Updated Vital Signs BP 139/80   Pulse 65   Temp 97.9 F (36.6 C)   Resp 17   SpO2 98%   Visual Acuity Right Eye Distance:   Left Eye Distance:   Bilateral Distance:    Right Eye Near:   Left Eye Near:    Bilateral Near:     Physical Exam Constitutional:      Appearance: Normal appearance.  Eyes:     Extraocular Movements: Extraocular movements intact.  Pulmonary:     Effort: Pulmonary effort is normal.  Skin:    Comments: Erythematous macular papular and  blistering rash present to the right shoulder blade extending to the right flank , tender to palpation, nondraining  Neurological:     Mental Status: She is alert and oriented to person, place, and time. Mental status is at baseline.      UC Treatments / Results  Labs (all labs ordered are listed, but only abnormal results are displayed) Labs Reviewed - No data to display  EKG   Radiology No results found.  Procedures Procedures (including critical care time)  Medications Ordered in UC Medications  methylPREDNISolone acetate (DEPO-MEDROL) injection 60 mg (60 mg Intramuscular Given 03/10/23 1130)    Initial Impression / Assessment and Plan / UC Course  I have reviewed the triage vital signs and the nursing notes.  Pertinent labs & imaging results that were available during my care of the patient were reviewed by me and considered in my medical decision making (see chart for details).  Herpes zoster without complication  Presentation of rash with persisting pain present for 2 weeks is most consistent with  shingles, discussed with patient, has been attempting over-the-counter analgesics and now steroids without relief most likely not muscular pain now the rash is present, was initiating valacyclovir, steroid injection given in office per patient request that she has had success with this in the past, prescribed lidocaine ointment for additional support and comfort, recommended further topical  medications, ice with activity continued as tolerated, advised that if symptoms continue to persist that she is to follow-up with her primary doctor for further management Final Clinical Impressions(s) / UC Diagnoses   Final diagnoses:  Herpes zoster without complication     Discharge Instructions      Today you are being treated for shingles, based on the description of your symptoms and presentation of your rash this is the most likely cause for your shoulder pain  You have been given an injection of steroid today here in office ideally will see some improvement in the next 30 minutes to an hour, starting tomorrow he may restart oral steroids, avoid use of ibuprofen as she take the medicine  Begin use of valacyclovir taking every 8 hours for the next 7 days to reduce the amount of virus in your body that is causing the rash and symptoms, will help rash to resolve quicker which will help to minimize symptoms  Rash is considered contagious until fully healed and scabbed therefore keep covered especially when around other people  You may apply topical lidocaine cream over the rash to help soothe the skin  You may apply ice over the affected area in 10 to 15-minute intervals for comfort  If you continue to have pain please follow-up with your primary doctor for further evaluation and management   ED Prescriptions     Medication Sig Dispense Auth. Provider   Baclofen 5 MG TABS Take 1 tablet (5 mg total) by mouth at bedtime as needed. 12 tablet Jeweliana Dudgeon R, NP   lidocaine (XYLOCAINE) 5 % ointment  Apply 1 Application topically as needed. 35.44 g Salli Quarry R, NP   valACYclovir (VALTREX) 1000 MG tablet Take 1 tablet (1,000 mg total) by mouth 3 (three) times daily for 7 days. 21 tablet Olubunmi Rothenberger, Elita Boone, NP      PDMP not reviewed this encounter.   Valinda Hoar, NP 03/10/23 1153

## 2023-03-10 NOTE — ED Triage Notes (Addendum)
Patient to Urgent Care with complaints of upper back pain. Pain wakes her up during the night.   Symptoms started approx 2 weeks ago. Reports that she was standing at her kitchen sink and felt a sudden, sharp pain behind her right shoulder blade. Reports she has been doing a lot of yard work/ moving tree limbs. Has been using pain patches but broke out in an itch rash on her back.  Seen by her pcp 10/15- prescribed Steroids.

## 2023-03-14 NOTE — Plan of Care (Signed)
[facility] Tonsillectomy/Adenoidectomy, Postoperative PEDS care plan entered in error.

## 2023-04-12 DIAGNOSIS — F5101 Primary insomnia: Secondary | ICD-10-CM | POA: Diagnosis not present

## 2023-04-12 DIAGNOSIS — S83411D Sprain of medial collateral ligament of right knee, subsequent encounter: Secondary | ICD-10-CM | POA: Diagnosis not present

## 2023-04-12 DIAGNOSIS — N1831 Chronic kidney disease, stage 3a: Secondary | ICD-10-CM | POA: Diagnosis not present

## 2023-04-12 DIAGNOSIS — I712 Thoracic aortic aneurysm, without rupture, unspecified: Secondary | ICD-10-CM | POA: Diagnosis not present

## 2023-04-12 DIAGNOSIS — I10 Essential (primary) hypertension: Secondary | ICD-10-CM | POA: Diagnosis not present

## 2023-04-12 DIAGNOSIS — B0229 Other postherpetic nervous system involvement: Secondary | ICD-10-CM | POA: Diagnosis not present

## 2023-10-11 DIAGNOSIS — F5101 Primary insomnia: Secondary | ICD-10-CM | POA: Diagnosis not present

## 2023-10-11 DIAGNOSIS — I10 Essential (primary) hypertension: Secondary | ICD-10-CM | POA: Diagnosis not present

## 2023-10-11 DIAGNOSIS — R42 Dizziness and giddiness: Secondary | ICD-10-CM | POA: Diagnosis not present

## 2023-10-11 DIAGNOSIS — N1831 Chronic kidney disease, stage 3a: Secondary | ICD-10-CM | POA: Diagnosis not present

## 2023-10-11 DIAGNOSIS — E7800 Pure hypercholesterolemia, unspecified: Secondary | ICD-10-CM | POA: Diagnosis not present

## 2023-10-11 DIAGNOSIS — B0229 Other postherpetic nervous system involvement: Secondary | ICD-10-CM | POA: Diagnosis not present

## 2023-10-11 DIAGNOSIS — Z0000 Encounter for general adult medical examination without abnormal findings: Secondary | ICD-10-CM | POA: Diagnosis not present

## 2023-12-20 DIAGNOSIS — N3946 Mixed incontinence: Secondary | ICD-10-CM | POA: Diagnosis not present

## 2023-12-20 DIAGNOSIS — D49512 Neoplasm of unspecified behavior of left kidney: Secondary | ICD-10-CM | POA: Diagnosis not present

## 2024-04-12 DIAGNOSIS — N1831 Chronic kidney disease, stage 3a: Secondary | ICD-10-CM | POA: Diagnosis not present

## 2024-04-12 DIAGNOSIS — E7800 Pure hypercholesterolemia, unspecified: Secondary | ICD-10-CM | POA: Diagnosis not present

## 2024-04-12 DIAGNOSIS — I10 Essential (primary) hypertension: Secondary | ICD-10-CM | POA: Diagnosis not present

## 2024-04-12 DIAGNOSIS — R42 Dizziness and giddiness: Secondary | ICD-10-CM | POA: Diagnosis not present

## 2024-04-12 DIAGNOSIS — B0229 Other postherpetic nervous system involvement: Secondary | ICD-10-CM | POA: Diagnosis not present

## 2024-04-12 DIAGNOSIS — F5101 Primary insomnia: Secondary | ICD-10-CM | POA: Diagnosis not present

## 2024-04-12 DIAGNOSIS — L219 Seborrheic dermatitis, unspecified: Secondary | ICD-10-CM | POA: Diagnosis not present

## 2024-04-24 ENCOUNTER — Ambulatory Visit: Admitting: Internal Medicine
